# Patient Record
Sex: Female | Born: 1935 | Race: White | Hispanic: No | Marital: Married | Smoking: Never smoker
Health system: Southern US, Community
[De-identification: ages and names within clinical notes are randomized; demographics above are authoritative.]

## PROBLEM LIST (undated history)

## (undated) DIAGNOSIS — C801 Malignant (primary) neoplasm, unspecified: Secondary | ICD-10-CM

## (undated) DIAGNOSIS — D259 Leiomyoma of uterus, unspecified: Secondary | ICD-10-CM

## (undated) DIAGNOSIS — I739 Peripheral vascular disease, unspecified: Secondary | ICD-10-CM

## (undated) DIAGNOSIS — R739 Hyperglycemia, unspecified: Secondary | ICD-10-CM

## (undated) DIAGNOSIS — E785 Hyperlipidemia, unspecified: Secondary | ICD-10-CM

## (undated) DIAGNOSIS — I6529 Occlusion and stenosis of unspecified carotid artery: Secondary | ICD-10-CM

## (undated) DIAGNOSIS — M1990 Unspecified osteoarthritis, unspecified site: Secondary | ICD-10-CM

## (undated) DIAGNOSIS — N3281 Overactive bladder: Secondary | ICD-10-CM

## (undated) DIAGNOSIS — I10 Essential (primary) hypertension: Secondary | ICD-10-CM

## (undated) HISTORY — DX: Essential (primary) hypertension: I10

## (undated) HISTORY — DX: Occlusion and stenosis of unspecified carotid artery: I65.29

## (undated) HISTORY — DX: Hyperglycemia, unspecified: R73.9

## (undated) HISTORY — DX: Peripheral vascular disease, unspecified: I73.9

## (undated) HISTORY — PX: DIAGNOSTIC LAPAROSCOPY: SUR761

## (undated) HISTORY — DX: Overactive bladder: N32.81

## (undated) HISTORY — DX: Leiomyoma of uterus, unspecified: D25.9

## (undated) HISTORY — DX: Hyperlipidemia, unspecified: E78.5

## (undated) HISTORY — DX: Unspecified osteoarthritis, unspecified site: M19.90

---

## 1998-07-01 ENCOUNTER — Encounter: Payer: Self-pay | Admitting: *Deleted

## 1998-07-01 ENCOUNTER — Ambulatory Visit (HOSPITAL_COMMUNITY): Admission: RE | Admit: 1998-07-01 | Discharge: 1998-07-01 | Payer: Self-pay | Admitting: *Deleted

## 1998-07-17 ENCOUNTER — Ambulatory Visit (HOSPITAL_COMMUNITY): Admission: RE | Admit: 1998-07-17 | Discharge: 1998-07-17 | Payer: Self-pay | Admitting: *Deleted

## 1998-07-17 ENCOUNTER — Encounter: Payer: Self-pay | Admitting: *Deleted

## 1999-05-04 HISTORY — PX: COLONOSCOPY: SHX174

## 1999-05-19 ENCOUNTER — Other Ambulatory Visit: Admission: RE | Admit: 1999-05-19 | Discharge: 1999-05-19 | Payer: Self-pay | Admitting: Obstetrics and Gynecology

## 2000-02-24 ENCOUNTER — Ambulatory Visit (HOSPITAL_COMMUNITY): Admission: RE | Admit: 2000-02-24 | Discharge: 2000-02-24 | Payer: Self-pay | Admitting: *Deleted

## 2000-02-24 ENCOUNTER — Encounter: Payer: Self-pay | Admitting: *Deleted

## 2000-03-14 ENCOUNTER — Ambulatory Visit (HOSPITAL_COMMUNITY): Admission: RE | Admit: 2000-03-14 | Discharge: 2000-03-14 | Payer: Self-pay | Admitting: *Deleted

## 2000-03-14 ENCOUNTER — Encounter: Payer: Self-pay | Admitting: *Deleted

## 2000-09-12 ENCOUNTER — Other Ambulatory Visit: Admission: RE | Admit: 2000-09-12 | Discharge: 2000-09-12 | Payer: Self-pay | Admitting: Obstetrics and Gynecology

## 2001-10-23 ENCOUNTER — Encounter: Payer: Self-pay | Admitting: Obstetrics and Gynecology

## 2001-10-23 ENCOUNTER — Ambulatory Visit (HOSPITAL_COMMUNITY): Admission: RE | Admit: 2001-10-23 | Discharge: 2001-10-23 | Payer: Self-pay | Admitting: Obstetrics and Gynecology

## 2002-12-24 ENCOUNTER — Encounter: Payer: Self-pay | Admitting: Obstetrics and Gynecology

## 2002-12-24 ENCOUNTER — Ambulatory Visit (HOSPITAL_COMMUNITY): Admission: RE | Admit: 2002-12-24 | Discharge: 2002-12-24 | Payer: Self-pay | Admitting: Obstetrics and Gynecology

## 2009-05-06 ENCOUNTER — Encounter (INDEPENDENT_AMBULATORY_CARE_PROVIDER_SITE_OTHER): Payer: Self-pay | Admitting: *Deleted

## 2009-12-15 ENCOUNTER — Telehealth: Payer: Self-pay | Admitting: Internal Medicine

## 2010-06-02 NOTE — Progress Notes (Signed)
Summary: Schedule Colonoscopy  Phone Note Outgoing Call Call back at [facility] Phone [phone]   Call placed by: Harlow Mares CMA Duncan Dull),  December 15, 2009 8:58 AM Call placed to: Patient Summary of Call: Left message on patients machine to call back. patient is due for recall colonoscopy her last colonoscopy was with Dr. Victorino Dike in 2001 Initial call taken by: Harlow Mares CMA Duncan Dull),  December 15, 2009 8:58 AM  Follow-up for Phone Call        pt will c/b to sch COL at a later time, husband has broken two ribs and she needs to be taking care of him right now Follow-up by: Harlow Mares CMA Duncan Dull),  December 15, 2009 9:31 AM

## 2010-06-02 NOTE — Letter (Signed)
Summary: Colonoscopy Letter  [facility]  [address] [place], [place] [address]   Phone: [phone]  Fax: [phone]      May 06, 2009 MRN: [id number]   Rachel Mcguire [address] [place], [place]  [address]   Dear Ms. Poblano,   According to your medical record, it is time for you to schedule a Colonoscopy. The American Cancer Society recommends this procedure as a method to detect early colon cancer. Patients with a family history of colon cancer, or a personal history of colon polyps or inflammatory bowel disease are at increased risk.  This letter has beeen generated based on the recommendations made at the time of your procedure. If you feel that in your particular situation this may no longer apply, please contact our office.  Please call our office at [phone] to schedule this appointment or to update your records at your earliest convenience.  Thank you for cooperating with [place] to provide you with the very best care possible.   Sincerely,  Hedwig Morton. Juanda Chance, M.D.  [facility] [phone]

## 2010-10-06 ENCOUNTER — Encounter: Payer: Self-pay | Admitting: Internal Medicine

## 2010-10-16 ENCOUNTER — Telehealth: Payer: Self-pay

## 2010-10-16 NOTE — Telephone Encounter (Signed)
Message copied by Annett Fabian on Fri Oct 16, 2010  9:33 AM ------      Message from: Iva Boop      Created: Tue Oct 13, 2010  8:20 PM      Regarding: RE: ? schedule colonoscpy       Call her and ask her who she wants and arrange please      ----- Message -----         From: Rossie Muskrat, RN,CGRN         Sent: 10/08/2010   3:51 PM           To: Iva Boop, MD      Subject: RE: ? schedule colonoscpy                                There was a recall letter sent to her last year for [facility].  She does have a history with Dr Doreatha Martin      ----- Message -----         From: Iva Boop, MD         Sent: 10/08/2010   3:33 PM           To: Rossie Muskrat, RN,CGRN      Subject: RE: ? schedule colonoscpy                                I was not sure either - ask patient for clarification ? If she was Dr. Blossom Hoops and being assigned      ----- Message -----         From: Rossie Muskrat, RN,CGRN         Sent: 10/08/2010   3:30 PM           To: Iva Boop, MD      Subject: FW: ? 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      ----- Message -----         From: Iva Boop, MD         Sent: 10/06/2010   4:44 PM           To: Rossie Muskrat, RN,CGRN      Subject: ? schedule colonoscpy                                    She called [place] in 2011 re needing to delay colonoscopy            Chart came to me recently            Please call her and see if she is ready to do and arrange direct colonoscopy [phone]

## 2010-10-16 NOTE — Telephone Encounter (Signed)
Patient is scheduled with Dr Leone Payor for 11/19/10 for colon and pre-visit 11/10/10 8:00

## 2010-11-10 ENCOUNTER — Ambulatory Visit (AMBULATORY_SURGERY_CENTER): Payer: Medicare Other

## 2010-11-10 VITALS — Ht 62.5 in | Wt 137.3 lb

## 2010-11-10 DIAGNOSIS — Z1211 Encounter for screening for malignant neoplasm of colon: Secondary | ICD-10-CM

## 2010-11-10 MED ORDER — PEG-KCL-NACL-NASULF-NA ASC-C 100 G PO SOLR: 1.0000 | Freq: Once | ORAL | Status: AC

## 2010-11-19 ENCOUNTER — Ambulatory Visit (AMBULATORY_SURGERY_CENTER): Payer: Medicare Other | Admitting: Internal Medicine

## 2010-11-19 ENCOUNTER — Encounter: Payer: Self-pay | Admitting: Internal Medicine

## 2010-11-19 VITALS — BP 132/76 | HR 64 | Temp 99.1°F | Resp 18 | Ht 62.5 in | Wt 131.0 lb

## 2010-11-19 DIAGNOSIS — Z1211 Encounter for screening for malignant neoplasm of colon: Secondary | ICD-10-CM

## 2010-11-19 DIAGNOSIS — K5730 Diverticulosis of large intestine without perforation or abscess without bleeding: Secondary | ICD-10-CM

## 2010-11-19 DIAGNOSIS — K649 Unspecified hemorrhoids: Secondary | ICD-10-CM

## 2010-11-19 MED ORDER — SODIUM CHLORIDE 0.9 % IV SOLN: 500.0000 mL | INTRAVENOUS | Status: DC

## 2010-11-19 NOTE — Patient Instructions (Signed)
Please read the handouts given to you by your recovery room nurse.    Please, increase the fiber in your diet.  It will help improve the diverticulosis and hemorrhoids found today.  Resume your routine medications today.   If you have any questions or concerns, please call us at [phone].  Thank-you.

## 2010-11-20 ENCOUNTER — Telehealth: Payer: Self-pay | Admitting: *Deleted

## 2010-11-20 NOTE — Telephone Encounter (Signed)

## 2011-02-03 ENCOUNTER — Other Ambulatory Visit: Payer: Self-pay

## 2011-02-19 ENCOUNTER — Encounter: Payer: Self-pay | Admitting: Vascular Surgery

## 2011-02-23 ENCOUNTER — Encounter: Payer: Medicare Other | Admitting: Vascular Surgery

## 2011-03-01 ENCOUNTER — Encounter: Payer: Self-pay | Admitting: Vascular Surgery

## 2011-03-01 ENCOUNTER — Ambulatory Visit (INDEPENDENT_AMBULATORY_CARE_PROVIDER_SITE_OTHER): Payer: Medicare Other | Admitting: Vascular Surgery

## 2011-03-01 DIAGNOSIS — I6529 Occlusion and stenosis of unspecified carotid artery: Secondary | ICD-10-CM

## 2011-03-02 ENCOUNTER — Ambulatory Visit (INDEPENDENT_AMBULATORY_CARE_PROVIDER_SITE_OTHER): Payer: Medicare Other | Admitting: Vascular Surgery

## 2011-03-02 ENCOUNTER — Encounter: Payer: Self-pay | Admitting: Vascular Surgery

## 2011-03-02 ENCOUNTER — Other Ambulatory Visit: Payer: Self-pay | Admitting: Vascular Surgery

## 2011-03-02 VITALS — BP 143/79 | HR 78 | Resp 16 | Ht 62.5 in | Wt 132.0 lb

## 2011-03-02 DIAGNOSIS — Z01818 Encounter for other preprocedural examination: Secondary | ICD-10-CM

## 2011-03-02 DIAGNOSIS — I6529 Occlusion and stenosis of unspecified carotid artery: Secondary | ICD-10-CM

## 2011-03-02 NOTE — Progress Notes (Signed)
The patient presents today for evaluation of asymptomatic left carotid stenosis. She had been seen in a carotid screening study and was found to have significant left carotid disease. She is seen today for further evaluation. She denies any prior amaurosis fugax transient ischemic attack or stroke. She is quite active without any major physical limitation. He does not have any significant cardiac disease.  Past Medical History  Diagnosis Date  . Hypertension   . Hyperlipidemia   . Osteoporosis   . Hyperglycemia   . Peripheral vascular disease   . Carotid artery occlusion   . Uterine fibroid   . Overactive bladder   . Arthritis     both hands    History  Substance Use Topics  . Smoking status: Never Smoker   . Smokeless tobacco: Never Used  . Alcohol Use: No    Family History  Problem Relation Age of Onset  . Heart disease Father   . Stroke Father   . Breast cancer Paternal Grandmother     Allergies  Allergen Reactions  . Penicillins Itching and Rash    Current outpatient prescriptions:amLODipine-benazepril (LOTREL) 5-20 MG per capsule, Take 1 capsule by mouth daily.  , Disp: , Rfl: ;  aspirin 81 MG tablet, Take 81 mg by mouth daily.  , Disp: , Rfl: ;  b complex vitamins tablet, Take 1 tablet by mouth daily.  , Disp: , Rfl: ;  calcium-vitamin D (OSCAL WITH D) 500-200 MG-UNIT per tablet, Take 1 tablet by mouth 2 (two) times daily.  , Disp: , Rfl:  Cholecalciferol (VITAMIN D3) 1000 UNITS CAPS, Take 1,000 Units by mouth daily.  , Disp: , Rfl: ;  Coenzyme Q10 (CO Q 10) 100 MG CAPS, Take by mouth.  , Disp: , Rfl: ;  fish oil-omega-3 fatty acids 1000 MG capsule, Take 2 g by mouth daily.  , Disp: , Rfl: ;  hydrochlorothiazide 25 MG tablet, Take 25 mg by mouth daily.  , Disp: , Rfl: ;  Magnesium 250 MG TABS, Take by mouth.  , Disp: , Rfl:  multivitamin (THERAGRAN) per tablet, Take 1 tablet by mouth daily.  , Disp: , Rfl: ;  oxybutynin (DITROPAN-XL) 10 MG 24 hr tablet, 20 mg. , Disp: ,  Rfl: ;  Red Yeast Rice 600 MG CAPS, Take 2 capsules by mouth daily.  , Disp: , Rfl: ;  vitamin C (ASCORBIC ACID) 500 MG tablet, Take 500 mg by mouth daily.  , Disp: , Rfl: ;  glucosamine-chondroitin 500-400 MG tablet, Take 1 tablet by mouth daily.  , Disp: , Rfl:  Current facility-administered medications:0.9 %  sodium chloride infusion, 500 mL, Intravenous, Continuous, Iva Boop, MD  BP 143/79  Pulse 78  Resp 16  Ht 5' 2.5" (1.588 m)  Wt 132 lb (59.875 kg)  BMI 23.76 kg/m2  SpO2 97%  Body mass index is 23.76 kg/(m^2).       Review of systems: Urinary frequency, arthritis in her fingers otherwise normal with no positive review of systems  Physical exam ON well-developed well-nourished white female no acute distress. HEENT normal. Chest clear bilaterally without rales or wheezes. Heart regular rate and rhythm. Carotid arteries without bruits. 2+ radial pulses bilaterally. Abdomen soft nontender no masses noted. She is grossly intact neurologically. Psychiatric normal affect.  Carotid duplex in our office: This reveals high and a 60-79% stenosis in her left internal carotid artery moderate 40-59% on the right. He does have extensive calcification her left making exact stenosis difficult and  this may be underestimation.  Impression and plan: At least moderate to severe left internal carotid artery stenosis, asymptomatic. I have recommended CT angiogram for further evaluation. I suspect that this indeed represents critical stenosis and so would recommend endarterectomy. I discussed this at length with the patient and her husband present. We will proceed with outpatient CT angiogram of her neck and this does confirm high-grade stenosis would recommend endarterectomy for reduction of stroke risk. Procedure including 1-2% risk of stroke with surgery. She understands and wished to proceed following CT scan if indicated

## 2011-03-04 NOTE — Procedures (Unsigned)
CAROTID DUPLEX EXAM  INDICATION:  Followup carotid stenosis.  HISTORY: Diabetes:  No Cardiac:  No Hypertension:  Yes Smoking:  No Previous Surgery:  No carotid intervention CV History:  Asymptomatic Amaurosis Fugax No, Paresthesias No, Hemiparesis No                                      RIGHT             LEFT Brachial systolic pressure:         138               158 Brachial Doppler waveforms:         Within normal limits                Within normal limits Vertebral direction of flow:        Antegrade         Antegrade DUPLEX VELOCITIES (cm/sec) CCA peak systolic                   137               86 ECA peak systolic                   113               99 ICA peak systolic                   146               303 ICA end diastolic                   41                95 PLAQUE MORPHOLOGY:                  Calcified         Calcified PLAQUE AMOUNT:                      Moderate          Moderate to severe PLAQUE LOCATION:                    CCA/ICA           CCA/ICA  IMPRESSION: 1. Right internal carotid artery stenosis present in the 40%-59%     range. 2. Left internal carotid artery stenosis present in the 60%-79% range,     due to calcified plaque and acoustic shadowing stenosis may be     underestimated. 3. Bilateral vertebral arteries are patent and antegrade. 4. No previous study done at [facility] for     comparison.  ___________________________________________ Larina Earthly, M.D.  SH/MEDQ  D:  03/01/2011  T:  03/01/2011  Job:  [id number]

## 2011-03-05 ENCOUNTER — Ambulatory Visit
Admission: RE | Admit: 2011-03-05 | Discharge: 2011-03-05 | Disposition: A | Discharge status: Home / Self Care | Payer: Medicare Other | Source: Ambulatory Visit | Attending: Vascular Surgery | Admitting: Vascular Surgery

## 2011-03-05 DIAGNOSIS — I6529 Occlusion and stenosis of unspecified carotid artery: Secondary | ICD-10-CM

## 2011-03-05 DIAGNOSIS — Z01818 Encounter for other preprocedural examination: Secondary | ICD-10-CM

## 2011-03-05 MED ORDER — IOHEXOL 350 MG/ML SOLN
100.0000 mL | Freq: Once | INTRAVENOUS | Status: AC | PRN
  Administered 2011-03-05: 100 mL via INTRAVENOUS

## 2011-03-11 ENCOUNTER — Encounter: Payer: Self-pay | Admitting: Vascular Surgery

## 2011-03-11 DIAGNOSIS — I6529 Occlusion and stenosis of unspecified carotid artery: Secondary | ICD-10-CM | POA: Insufficient documentation

## 2011-03-29 ENCOUNTER — Encounter (HOSPITAL_COMMUNITY): Payer: Self-pay | Admitting: Pharmacy Technician

## 2011-04-01 ENCOUNTER — Other Ambulatory Visit: Payer: Self-pay | Admitting: *Deleted

## 2011-04-05 ENCOUNTER — Other Ambulatory Visit: Payer: Self-pay

## 2011-04-05 ENCOUNTER — Ambulatory Visit (HOSPITAL_COMMUNITY)
Admission: RE | Admit: 2011-04-05 | Discharge: 2011-04-05 | Disposition: A | Discharge status: Home / Self Care | Payer: Medicare Other | Source: Ambulatory Visit | Attending: Anesthesiology | Admitting: Anesthesiology

## 2011-04-05 ENCOUNTER — Encounter (HOSPITAL_COMMUNITY): Payer: Self-pay

## 2011-04-05 ENCOUNTER — Encounter (HOSPITAL_COMMUNITY)
Admission: RE | Admit: 2011-04-05 | Discharge: 2011-04-05 | Disposition: A | Discharge status: Home / Self Care | Payer: Medicare Other | Source: Ambulatory Visit | Attending: Vascular Surgery | Admitting: Vascular Surgery

## 2011-04-05 DIAGNOSIS — Z01818 Encounter for other preprocedural examination: Secondary | ICD-10-CM | POA: Insufficient documentation

## 2011-04-05 DIAGNOSIS — Z01812 Encounter for preprocedural laboratory examination: Secondary | ICD-10-CM | POA: Insufficient documentation

## 2011-04-05 DIAGNOSIS — Z01810 Encounter for preprocedural cardiovascular examination: Secondary | ICD-10-CM | POA: Insufficient documentation

## 2011-04-05 HISTORY — DX: Malignant (primary) neoplasm, unspecified: C80.1

## 2011-04-05 LAB — CBC
MCH: 28.9 pg (ref 26.0–34.0)
MCHC: 33 g/dL (ref 30.0–36.0)
Platelets: 292 10*3/uL (ref 150–400)

## 2011-04-05 LAB — URINALYSIS, ROUTINE W REFLEX MICROSCOPIC
Glucose, UA: NEGATIVE mg/dL
Leukocytes, UA: NEGATIVE
Protein, ur: NEGATIVE mg/dL

## 2011-04-05 LAB — DIFFERENTIAL
Eosinophils Relative: 1 % (ref 0–5)
Lymphocytes Relative: 29 % (ref 12–46)
Lymphs Abs: 1.2 10*3/uL (ref 0.7–4.0)
Monocytes Absolute: 0.4 10*3/uL (ref 0.1–1.0)

## 2011-04-05 LAB — COMPREHENSIVE METABOLIC PANEL
ALT: 18 U/L (ref 0–35)
AST: 20 U/L (ref 0–37)
Albumin: 4.2 g/dL (ref 3.5–5.2)
Calcium: 10 mg/dL (ref 8.4–10.5)
Sodium: 140 mEq/L (ref 135–145)
Total Protein: 7.8 g/dL (ref 6.0–8.3)

## 2011-04-05 LAB — SURGICAL PCR SCREEN: MRSA, PCR: NEGATIVE

## 2011-04-05 LAB — PROTIME-INR: Prothrombin Time: 13.1 seconds (ref 11.6–15.2)

## 2011-04-05 LAB — TYPE AND SCREEN: ABO/RH(D): A POS

## 2011-04-05 MED ORDER — VANCOMYCIN HCL IN DEXTROSE 1-5 GM/200ML-% IV SOLN: 1000.0000 mg | INTRAVENOUS | Status: DC

## 2011-04-05 NOTE — Pre-Procedure Instructions (Signed)
20 DENYSE FILLION  04/05/2011   Your procedure is scheduled on:  04/12/11  Report to [facility] at 0530 AM.  Call this number if you have problems the morning of surgery: [phone]   Remember:   Do not eat food:After Midnight.  May have clear liquids: up to 4 Hours before arrival.  Clear liquids include soda, tea, black coffee, apple or grape juice, broth.  Take these medicines the morning of surgery with A SIP OF WATER: AMLODIPINE  DITROPAN   Do not wear jewelry, make-up or nail polish.  Do not wear lotions, powders, or perfumes. You may wear deodorant.  Do not shave 48 hours prior to surgery.  Do not bring valuables to the hospital.  Contacts, dentures or bridgework may not be worn into surgery.  Leave suitcase in the car. After surgery it may be brought to your room.  For patients admitted to the hospital, checkout time is 11:00 AM the day of discharge.   Patients discharged the day of surgery will not be allowed to drive home.  Name and phone number of your driver: Karrie Meres  [phone]  Special Instructions: CHG Shower Use Special Wash: 1/2 bottle night before surgery and 1/2 bottle morning of surgery.   Please read over the following fact sheets that you were given: Pain Booklet, Coughing and Deep Breathing, Blood Transfusion Information, MRSA Information and Surgical Site Infection Prevention

## 2011-04-12 ENCOUNTER — Encounter (HOSPITAL_COMMUNITY): Payer: Self-pay

## 2011-04-12 ENCOUNTER — Inpatient Hospital Stay (HOSPITAL_COMMUNITY): Payer: Medicare Other

## 2011-04-12 ENCOUNTER — Other Ambulatory Visit: Payer: Self-pay

## 2011-04-12 ENCOUNTER — Encounter (HOSPITAL_COMMUNITY)
Admission: RE | Disposition: A | Discharge status: Home / Self Care | Payer: Self-pay | Source: Ambulatory Visit | Attending: Vascular Surgery

## 2011-04-12 ENCOUNTER — Other Ambulatory Visit: Payer: Self-pay | Admitting: Vascular Surgery

## 2011-04-12 ENCOUNTER — Inpatient Hospital Stay (HOSPITAL_COMMUNITY)
Admission: RE | Admit: 2011-04-12 | Discharge: 2011-04-13 | DRG: 039 | Disposition: A | Discharge status: Home / Self Care | Payer: Medicare Other | Source: Ambulatory Visit | Attending: Vascular Surgery | Admitting: Vascular Surgery

## 2011-04-12 ENCOUNTER — Encounter (HOSPITAL_COMMUNITY): Payer: Self-pay | Admitting: *Deleted

## 2011-04-12 DIAGNOSIS — I6529 Occlusion and stenosis of unspecified carotid artery: Principal | ICD-10-CM

## 2011-04-12 DIAGNOSIS — Z85828 Personal history of other malignant neoplasm of skin: Secondary | ICD-10-CM

## 2011-04-12 DIAGNOSIS — M810 Age-related osteoporosis without current pathological fracture: Secondary | ICD-10-CM | POA: Diagnosis present

## 2011-04-12 HISTORY — PX: ENDARTERECTOMY: SHX5162

## 2011-04-12 HISTORY — PX: CAROTID ENDARTERECTOMY: SUR193

## 2011-04-12 SURGERY — ENDARTERECTOMY, CAROTID
Anesthesia: General | Site: Neck | Laterality: Left | Wound class: Clean

## 2011-04-12 MED ORDER — HYDRALAZINE HCL 20 MG/ML IJ SOLN
10.0000 mg | INTRAMUSCULAR | Status: DC | PRN
  Filled 2011-04-12: qty 0.5

## 2011-04-12 MED ORDER — SODIUM CHLORIDE 0.9 % IV SOLN
500.0000 mL | Freq: Once | INTRAVENOUS | Status: AC | PRN
  Administered 2011-04-12: 500 mL via INTRAVENOUS

## 2011-04-12 MED ORDER — ACETAMINOPHEN 325 MG PO TABS
325.0000 mg | ORAL_TABLET | ORAL | Status: DC | PRN
Start: 2011-04-12 — End: 2011-04-13

## 2011-04-12 MED ORDER — WHITE PETROLATUM GEL
Status: AC
  Filled 2011-04-12: qty 5

## 2011-04-12 MED ORDER — PROMETHAZINE HCL 25 MG/ML IJ SOLN
6.2500 mg | INTRAMUSCULAR | Status: DC | PRN
  Administered 2011-04-12: 6.25 mg via INTRAVENOUS

## 2011-04-12 MED ORDER — LABETALOL HCL 5 MG/ML IV SOLN: 10.0000 mg | INTRAVENOUS | Status: DC | PRN

## 2011-04-12 MED ORDER — HYDROCHLOROTHIAZIDE 25 MG PO TABS
25.0000 mg | ORAL_TABLET | Freq: Every day | ORAL | Status: DC
  Filled 2011-04-12 (×2): qty 1

## 2011-04-12 MED ORDER — ONDANSETRON HCL 4 MG/2ML IJ SOLN
INTRAMUSCULAR | Status: DC | PRN
  Administered 2011-04-12: 4 mg via INTRAVENOUS

## 2011-04-12 MED ORDER — OXYCODONE-ACETAMINOPHEN 5-325 MG PO TABS: 1.0000 | ORAL_TABLET | ORAL | Status: AC | PRN

## 2011-04-12 MED ORDER — MIDAZOLAM HCL 2 MG/2ML IJ SOLN: 1.0000 mg | INTRAMUSCULAR | Status: DC | PRN

## 2011-04-12 MED ORDER — OXYCODONE-ACETAMINOPHEN 5-325 MG PO TABS: 1.0000 | ORAL_TABLET | ORAL | Status: DC | PRN

## 2011-04-12 MED ORDER — ASPIRIN EC 325 MG PO TBEC
325.0000 mg | DELAYED_RELEASE_TABLET | Freq: Every day | ORAL | Status: DC
  Administered 2011-04-13: 325 mg via ORAL
  Filled 2011-04-12: qty 1

## 2011-04-12 MED ORDER — AMLODIPINE BESYLATE 10 MG PO TABS
10.0000 mg | ORAL_TABLET | Freq: Every day | ORAL | Status: DC
  Filled 2011-04-12 (×2): qty 1

## 2011-04-12 MED ORDER — HYDROMORPHONE HCL PF 1 MG/ML IJ SOLN
0.2500 mg | INTRAMUSCULAR | Status: DC | PRN
  Administered 2011-04-12 (×2): 0.25 mg via INTRAVENOUS
  Administered 2011-04-12 (×2): 0.5 mg via INTRAVENOUS

## 2011-04-12 MED ORDER — POTASSIUM CHLORIDE CRYS ER 20 MEQ PO TBCR: 20.0000 meq | EXTENDED_RELEASE_TABLET | Freq: Once | ORAL | Status: AC | PRN

## 2011-04-12 MED ORDER — OXYBUTYNIN CHLORIDE ER 10 MG PO TB24
10.0000 mg | ORAL_TABLET | Freq: Two times a day (BID) | ORAL | Status: DC
  Administered 2011-04-12 – 2011-04-13 (×2): 10 mg via ORAL
  Filled 2011-04-12 (×3): qty 1

## 2011-04-12 MED ORDER — DOPAMINE-DEXTROSE 3.2-5 MG/ML-% IV SOLN
INTRAVENOUS | Status: AC
  Filled 2011-04-12: qty 250

## 2011-04-12 MED ORDER — MORPHINE SULFATE 2 MG/ML IJ SOLN
2.0000 mg | INTRAMUSCULAR | Status: DC | PRN
  Administered 2011-04-12 (×2): 2 mg via INTRAVENOUS
  Filled 2011-04-12 (×2): qty 1

## 2011-04-12 MED ORDER — FENTANYL CITRATE 0.05 MG/ML IJ SOLN: 50.0000 ug | INTRAMUSCULAR | Status: DC | PRN

## 2011-04-12 MED ORDER — GUAIFENESIN-DM 100-10 MG/5ML PO SYRP: 15.0000 mL | ORAL_SOLUTION | ORAL | Status: DC | PRN

## 2011-04-12 MED ORDER — PROTAMINE SULFATE 10 MG/ML IV SOLN
INTRAVENOUS | Status: DC | PRN
  Administered 2011-04-12: 50 mg via INTRAVENOUS

## 2011-04-12 MED ORDER — ASPIRIN EC 81 MG PO TBEC
81.0000 mg | DELAYED_RELEASE_TABLET | Freq: Every day | ORAL | Status: DC
  Filled 2011-04-12: qty 1

## 2011-04-12 MED ORDER — HEPARIN SODIUM (PORCINE) 5000 UNIT/ML IJ SOLN
INTRAMUSCULAR | Status: DC | PRN
  Administered 2011-04-12: 08:00:00

## 2011-04-12 MED ORDER — SODIUM CHLORIDE 0.9 % IV SOLN: INTRAVENOUS | Status: DC

## 2011-04-12 MED ORDER — FAMOTIDINE IN NACL 20-0.9 MG/50ML-% IV SOLN
20.0000 mg | Freq: Two times a day (BID) | INTRAVENOUS | Status: DC
  Administered 2011-04-12: 20 mg via INTRAVENOUS
  Filled 2011-04-12 (×3): qty 50

## 2011-04-12 MED ORDER — ONDANSETRON HCL 4 MG/2ML IJ SOLN
4.0000 mg | Freq: Four times a day (QID) | INTRAMUSCULAR | Status: DC | PRN
  Administered 2011-04-12: 4 mg via INTRAVENOUS
  Filled 2011-04-12: qty 2

## 2011-04-12 MED ORDER — LACTATED RINGERS IV SOLN
INTRAVENOUS | Status: DC | PRN
  Administered 2011-04-12 (×2): via INTRAVENOUS

## 2011-04-12 MED ORDER — DOPAMINE-DEXTROSE 3.2-5 MG/ML-% IV SOLN
3.0000 ug/kg/min | INTRAVENOUS | Status: DC
  Administered 2011-04-12: 3 ug/kg/min via INTRAVENOUS
  Filled 2011-04-12: qty 250

## 2011-04-12 MED ORDER — AMLODIPINE BESY-BENAZEPRIL HCL 5-20 MG PO CAPS: 2.0000 | ORAL_CAPSULE | Freq: Every day | ORAL | Status: DC

## 2011-04-12 MED ORDER — GLYCOPYRROLATE 0.2 MG/ML IJ SOLN
INTRAMUSCULAR | Status: DC | PRN
  Administered 2011-04-12: 0.2 mg via INTRAVENOUS
  Administered 2011-04-12: .4 mg via INTRAVENOUS

## 2011-04-12 MED ORDER — VANCOMYCIN HCL IN DEXTROSE 1-5 GM/200ML-% IV SOLN
1000.0000 mg | Freq: Two times a day (BID) | INTRAVENOUS | Status: AC
  Administered 2011-04-12 – 2011-04-13 (×2): 1000 mg via INTRAVENOUS
  Filled 2011-04-12 (×2): qty 200

## 2011-04-12 MED ORDER — ASPIRIN 81 MG PO TABS: 81.0000 mg | ORAL_TABLET | Freq: Every day | ORAL | Status: DC

## 2011-04-12 MED ORDER — PHENOL 1.4 % MT LIQD: 1.0000 | OROMUCOSAL | Status: DC | PRN

## 2011-04-12 MED ORDER — VECURONIUM BROMIDE 10 MG IV SOLR
INTRAVENOUS | Status: DC | PRN
  Administered 2011-04-12: 8 mg via INTRAVENOUS
  Administered 2011-04-12 (×2): 1 mg via INTRAVENOUS

## 2011-04-12 MED ORDER — PROPOFOL 10 MG/ML IV EMUL
INTRAVENOUS | Status: DC | PRN
  Administered 2011-04-12: 125 mg via INTRAVENOUS

## 2011-04-12 MED ORDER — NEOSTIGMINE METHYLSULFATE 1 MG/ML IJ SOLN
INTRAMUSCULAR | Status: DC | PRN
  Administered 2011-04-12: 3 mg via INTRAVENOUS

## 2011-04-12 MED ORDER — METOPROLOL TARTRATE 1 MG/ML IV SOLN: 2.0000 mg | INTRAVENOUS | Status: DC | PRN

## 2011-04-12 MED ORDER — SODIUM CHLORIDE 0.9 % IR SOLN
Status: DC | PRN
  Administered 2011-04-12: 1

## 2011-04-12 MED ORDER — SODIUM CHLORIDE 0.9 % IV SOLN
10.0000 mg | INTRAVENOUS | Status: DC | PRN
  Administered 2011-04-12: 50 ug/min via INTRAVENOUS

## 2011-04-12 MED ORDER — DEXTROSE-NACL 5-0.9 % IV SOLN
INTRAVENOUS | Status: DC
  Administered 2011-04-12: 12:00:00 via INTRAVENOUS

## 2011-04-12 MED ORDER — LORAZEPAM 2 MG/ML IJ SOLN: 1.0000 mg | Freq: Once | INTRAMUSCULAR | Status: DC | PRN

## 2011-04-12 MED ORDER — ACETAMINOPHEN 650 MG RE SUPP: 325.0000 mg | RECTAL | Status: DC | PRN

## 2011-04-12 MED ORDER — BENAZEPRIL HCL 40 MG PO TABS
40.0000 mg | ORAL_TABLET | Freq: Every day | ORAL | Status: DC
  Filled 2011-04-12 (×2): qty 1

## 2011-04-12 MED ORDER — DOCUSATE SODIUM 100 MG PO CAPS
100.0000 mg | ORAL_CAPSULE | Freq: Every day | ORAL | Status: DC
  Administered 2011-04-13: 100 mg via ORAL
  Filled 2011-04-12: qty 1

## 2011-04-12 MED ORDER — HEPARIN SODIUM (PORCINE) 1000 UNIT/ML IJ SOLN
INTRAMUSCULAR | Status: DC | PRN
  Administered 2011-04-12: 6000 [IU] via INTRAVENOUS

## 2011-04-12 MED ORDER — PROMETHAZINE HCL 25 MG/ML IJ SOLN
INTRAMUSCULAR | Status: AC
  Filled 2011-04-12: qty 1

## 2011-04-12 MED ORDER — VANCOMYCIN HCL IN DEXTROSE 1-5 GM/200ML-% IV SOLN
INTRAVENOUS | Status: AC
  Filled 2011-04-12: qty 200

## 2011-04-12 MED ORDER — FENTANYL CITRATE 0.05 MG/ML IJ SOLN
INTRAMUSCULAR | Status: DC | PRN
  Administered 2011-04-12: 25 ug via INTRAVENOUS
  Administered 2011-04-12: 75 ug via INTRAVENOUS
  Administered 2011-04-12: 25 ug via INTRAVENOUS

## 2011-04-12 SURGICAL SUPPLY — 46 items
APL SKNCLS STERI-STRIP NONHPOA (GAUZE/BANDAGES/DRESSINGS) ×1
BENZOIN TINCTURE PRP APPL 2/3 (GAUZE/BANDAGES/DRESSINGS) ×2 IMPLANT
CANISTER SUCTION 2500CC (MISCELLANEOUS) ×2 IMPLANT
CATH ROBINSON RED A/P 18FR (CATHETERS) ×2 IMPLANT
CLOSURE STERI STRIP 1/2 X4 (GAUZE/BANDAGES/DRESSINGS) ×1 IMPLANT
CLOTH BEACON ORANGE TIMEOUT ST (SAFETY) ×2 IMPLANT
COVER SURGICAL LIGHT HANDLE (MISCELLANEOUS) ×4 IMPLANT
CRADLE DONUT ADULT HEAD (MISCELLANEOUS) ×2 IMPLANT
DECANTER SPIKE VIAL GLASS SM (MISCELLANEOUS) IMPLANT
DRAIN HEMOVAC 1/8 X 5 (WOUND CARE) IMPLANT
DRAPE WARM FLUID 44X44 (DRAPE) ×2 IMPLANT
DRSG COVADERM 4X8 (GAUZE/BANDAGES/DRESSINGS) ×1 IMPLANT
ELECT REM PT RETURN 9FT ADLT (ELECTROSURGICAL) ×2
ELECTRODE REM PT RTRN 9FT ADLT (ELECTROSURGICAL) ×1 IMPLANT
EVACUATOR SILICONE 100CC (DRAIN) IMPLANT
GEL ULTRASOUND 20GR AQUASONIC (MISCELLANEOUS) IMPLANT
GLOVE BIOGEL PI IND STRL 6.5 (GLOVE) IMPLANT
GLOVE BIOGEL PI IND STRL 7.5 (GLOVE) IMPLANT
GLOVE BIOGEL PI INDICATOR 6.5 (GLOVE) ×3
GLOVE BIOGEL PI INDICATOR 7.5 (GLOVE) ×2
GLOVE SS BIOGEL STRL SZ 7.5 (GLOVE) ×1 IMPLANT
GLOVE SS N UNI LF 6.0 STRL (GLOVE) ×1 IMPLANT
GLOVE SS N UNI LF 7.5 STRL (GLOVE) ×2 IMPLANT
GLOVE SUPERSENSE BIOGEL SZ 7.5 (GLOVE) ×1
GOWN STRL NON-REIN LRG LVL3 (GOWN DISPOSABLE) ×6 IMPLANT
KIT BASIN OR (CUSTOM PROCEDURE TRAY) ×2 IMPLANT
KIT ROOM TURNOVER OR (KITS) ×2 IMPLANT
NEEDLE 22X1 1/2 (OR ONLY) (NEEDLE) IMPLANT
NS IRRIG 1000ML POUR BTL (IV SOLUTION) ×4 IMPLANT
PACK CAROTID (CUSTOM PROCEDURE TRAY) ×2 IMPLANT
PAD ARMBOARD 7.5X6 YLW CONV (MISCELLANEOUS) ×4 IMPLANT
PATCH HEMASHIELD 8X75 (Vascular Products) ×1 IMPLANT
SHUNT CAROTID BYPASS 10 (VASCULAR PRODUCTS) IMPLANT
SHUNT CAROTID BYPASS 12FRX15.5 (VASCULAR PRODUCTS) IMPLANT
SPECIMEN JAR SMALL (MISCELLANEOUS) ×2 IMPLANT
STRIP CLOSURE SKIN 1/2X4 (GAUZE/BANDAGES/DRESSINGS) ×2 IMPLANT
SUT ETHILON 3 0 PS 1 (SUTURE) IMPLANT
SUT PROLENE 6 0 CC (SUTURE) ×2 IMPLANT
SUT VIC AB 3-0 SH 27 (SUTURE) ×4
SUT VIC AB 3-0 SH 27X BRD (SUTURE) ×2 IMPLANT
SUT VICRYL 4-0 PS2 18IN ABS (SUTURE) ×2 IMPLANT
SYR CONTROL 10ML LL (SYRINGE) IMPLANT
TOWEL OR 17X24 6PK STRL BLUE (TOWEL DISPOSABLE) ×2 IMPLANT
TOWEL OR 17X26 10 PK STRL BLUE (TOWEL DISPOSABLE) ×2 IMPLANT
TRAY FOLEY CATH 14FRSI W/METER (CATHETERS) ×2 IMPLANT
WATER STERILE IRR 1000ML POUR (IV SOLUTION) ×2 IMPLANT

## 2011-04-12 NOTE — H&P (Signed)
Larina Earthly, MD 03/02/2011 5:28 PM Signed  The patient presents today for evaluation of asymptomatic left carotid stenosis. She had been seen in a carotid screening study and was found to have significant left carotid disease. She is seen today for further evaluation. She denies any prior amaurosis fugax transient ischemic attack or stroke. She is quite active without any major physical limitation. He does not have any significant cardiac disease.  Past Medical History   Diagnosis  Date   .  Hypertension    .  Hyperlipidemia    .  Osteoporosis    .  Hyperglycemia    .  Peripheral vascular disease    .  Carotid artery occlusion    .  Uterine fibroid    .  Overactive bladder    .  Arthritis      both hands    History   Substance Use Topics   .  Smoking status:  Never Smoker   .  Smokeless tobacco:  Never Used   .  Alcohol Use:  No    Family History   Problem  Relation  Age of Onset   .  Heart disease  Father    .  Stroke  Father    .  Breast cancer  Paternal Grandmother     Allergies   Allergen  Reactions   .  Penicillins  Itching and Rash    Current outpatient prescriptions:amLODipine-benazepril (LOTREL) 5-20 MG per capsule, Take 1 capsule by mouth daily. , Disp: , Rfl: ; aspirin 81 MG tablet, Take 81 mg by mouth daily. , Disp: , Rfl: ; b complex vitamins tablet, Take 1 tablet by mouth daily. , Disp: , Rfl: ; calcium-vitamin D (OSCAL WITH D) 500-200 MG-UNIT per tablet, Take 1 tablet by mouth 2 (two) times daily. , Disp: , Rfl:  Cholecalciferol (VITAMIN D3) 1000 UNITS CAPS, Take 1,000 Units by mouth daily. , Disp: , Rfl: ; Coenzyme Q10 (CO Q 10) 100 MG CAPS, Take by mouth. , Disp: , Rfl: ; fish oil-omega-3 fatty acids 1000 MG capsule, Take 2 g by mouth daily. , Disp: , Rfl: ; hydrochlorothiazide 25 MG tablet, Take 25 mg by mouth daily. , Disp: , Rfl: ; Magnesium 250 MG TABS, Take by mouth. , Disp: , Rfl:  multivitamin (THERAGRAN) per tablet, Take 1 tablet by mouth daily. , Disp: ,  Rfl: ; oxybutynin (DITROPAN-XL) 10 MG 24 hr tablet, 20 mg. , Disp: , Rfl: ; Red Yeast Rice 600 MG CAPS, Take 2 capsules by mouth daily. , Disp: , Rfl: ; vitamin C (ASCORBIC ACID) 500 MG tablet, Take 500 mg by mouth daily. , Disp: , Rfl: ; glucosamine-chondroitin 500-400 MG tablet, Take 1 tablet by mouth daily. , Disp: , Rfl:  Current facility-administered medications:0.9 % sodium chloride infusion, 500 mL, Intravenous, Continuous, Iva Boop, MD  BP 143/79  Pulse 78  Resp 16  Ht 5' 2.5" (1.588 m)  Wt 132 lb (59.875 kg)  BMI 23.76 kg/m2  SpO2 97%  Body mass index is 23.76 kg/(m^2).  Review of systems: Urinary frequency, arthritis in her fingers otherwise normal with no positive review of systems  Physical exam ON well-developed well-nourished white female no acute distress. HEENT normal. Chest clear bilaterally without rales or wheezes. Heart regular rate and rhythm. Carotid arteries without bruits. 2+ radial pulses bilaterally. Abdomen soft nontender no masses noted. She is grossly intact neurologically. Psychiatric normal affect.  Carotid duplex in our office: This reveals high and a  60-79% stenosis in her left internal carotid artery moderate 40-59% on the right. He does have extensive calcification her left making exact stenosis difficult and this may be underestimation.  Impression and plan: At least moderate to severe left internal carotid artery stenosis, asymptomatic. I have recommended CT angiogram for further evaluation. I suspect that this indeed represents critical stenosis and so would recommend endarterectomy. I discussed this at length with the patient and her husband present. We will proceed with outpatient CT angiogram of her neck and this does confirm high-grade stenosis would recommend endarterectomy for reduction of stroke risk.  Procedure including 1-2% risk of stroke with surgery. She understands and wished to proceed following CT scan if indicated

## 2011-04-12 NOTE — H&P (Signed)
  [facility] H&P UPDATE  The patient has been re-examined and re-evaluated.  The patient's history and physical has been reviewed and is unchanged.  There is no change in pt. status or plan of care, stable for surgery.No change from H&P from 03/02/2011   Rachel Mcguire F 04/12/2011 7:25 AM [facility]

## 2011-04-12 NOTE — Transfer of Care (Signed)
Immediate Anesthesia Transfer of Care Note  Patient: Rachel Mcguire  Procedure(s) Performed:  ENDARTERECTOMY CAROTID  Patient Location: PACU  Anesthesia Type: General  Level of Consciousness: awake, alert , oriented and patient cooperative  Airway & Oxygen Therapy: Patient Spontanous Breathing and Patient connected to nasal cannula oxygen  Post-op Assessment: Report given to PACU RN, Post -op Vital signs reviewed and stable, Patient moving all extremities and Patient able to stick tongue midline  Post vital signs: Reviewed and stable  Complications: No apparent anesthesia complications

## 2011-04-12 NOTE — Interval H&P Note (Signed)
History and Physical Interval Note:  04/12/2011 7:29 AM  Rachel Mcguire  has presented today for surgery, with the diagnosis of Left ICA Stenosis  The various methods of treatment have been discussed with the patient and family. After consideration of risks, benefits and other options for treatment, the patient has consented to  Procedure(s): ENDARTERECTOMY CAROTID as a surgical intervention .  The patients' history has been reviewed, patient examined, no change in status, stable for surgery.  I have reviewed the patients' chart and labs.  Questions were answered to the patient's satisfaction.     Larina Earthly

## 2011-04-12 NOTE — Op Note (Signed)
[facility] [place]  Patient name: Rachel Mcguire MRN: [id number] DOB: 07/14/1931 Sex: female  04/12/2011 Pre-operative Diagnosis: Asymptomatic left carotid stenosis Post-operative diagnosis:  Same Surgeon:  Larina Earthly, M.D. Assistants:  Roczniac PA Procedure:    left carotid Endarterectomy with Dacron patch angioplasty Anesthesia:  General Blood Loss:  See anesthesia record Specimens:  Carotid Plaque to pathology  Indications for surgery:  Asymptomatic left ICA stenosis  Procedure in detail:  The patient was taken to the operating and placed in the supine position. The neck was prepped and draped in the usual sterile fashion. An incision was made anterior to the sternocleidomastoid muscle and continued with electrocautery through the platysma muscle. The muscle was retracted posteriorly and the carotid sheath was opened. The facial vein was ligated with 2-0 silk ties and divided. The common carotid artery was encircled with an umbilical tape and Rummel tourniquet. Dissection was continued onto the carotid bifurcation. The superior thyroid artery was controlled with a 2-0 silk Potts tie. The external carotid organ was encircled with a vessel loop and the internal carotid was encircled with umbilical tape and Rummel tourniquet. The hypoglossal and vagus nerves were identified and preserved.  The patient was given systemic heparinization. After adequate circulation time, the internal,external and common carotid arteries were occluded. The common carotid was opened with an 11 blade and the arteriotomy was continued with Potts scissors onto the internal carotid artery. A 10 shunt was passed up the internal carotid artery, allowed to back bleed, and then passed down the common carotid artery. The shunt was secured with Rummel tourniquet. The endarterectomy was begun on the common carotid artery  plaque was divided proximally with Potts scissors. The endarterectomy was  continued onto the carotid bifurcation. The external carotid was endarterectomized by eversion technique and the internal carotid artery was endarterectomized in an open fashion. Remaining debris was removed from the endarterectomy plane. A Dacron patch was brought to the field and sewn as a patch angioplasty. Prior to completing the anastomosis, the shunt was removed and the usual flushing maneuvers were undertaken. The anastomosis was then completed and flow was restored first to the external and then the internal carotid artery. Excellent flow characteristics were noted with hand-held Doppler in the internal and external carotid arteries.  The patient was given protamine to reverse the heparin. Hemostasis was obtained with electrocautery. The wounds were irrigated with saline. The wound was closed by first reapproximating the sternocleidomastoid muscle over the carotid artery with interrupted 3-0 Vicryl sutures. Next, the platysma was closed with a running 3-0 Vicryl suture. The skin was closed with a 4-0 subcuticular Vicryl suture. Benzoin and Steri-Strips were applied to the incision. A sterile dressing was placed over the incision. All sponge and needle counts were correct. The patient was awakened in the operating room, neurologically intact. They were transferred to the PACU in stable condition.   Disposition:  To PACU in stable condition,neurologically intact  Larina Earthly, M.D. [facility] [place] Office: [phone] Pager:  [phone]

## 2011-04-12 NOTE — Anesthesia Postprocedure Evaluation (Signed)
  Anesthesia Post-op Note  Patient: Rachel Mcguire  Procedure(s) Performed:  ENDARTERECTOMY CAROTID  Patient Location: PACU  Anesthesia Type: General  Level of Consciousness: awake, alert  and oriented  Airway and Oxygen Therapy: Patient Spontanous Breathing  Post-op Pain: mild  Post-op Assessment: Post-op Vital signs reviewed  Post-op Vital Signs: stable  Complications: No apparent anesthesia complications

## 2011-04-12 NOTE — Preoperative (Signed)
Beta Blockers   Reason not to administer Beta Blockers:Not Applicable 

## 2011-04-12 NOTE — Anesthesia Procedure Notes (Addendum)
Procedure Name: Intubation Date/Time: 04/12/2011 7:44 AM Performed by: Carmela Rima Pre-anesthesia Checklist: Patient identified, Timeout performed, Emergency Drugs available, Suction available and Patient being monitored Patient Re-evaluated:Patient Re-evaluated prior to inductionOxygen Delivery Method: Circle System Utilized Preoxygenation: Pre-oxygenation with 100% oxygen Intubation Type: IV induction Ventilation: Mask ventilation without difficulty Laryngoscope Size: Mac and 3 Grade View: Grade II Tube type: Oral Number of attempts: 1 (intubation by [employer], SRNA) Placement Confirmation: ETT inserted through vocal cords under direct vision,  breath sounds checked- equal and bilateral,  positive ETCO2 and CO2 detector Secured at: 21 cm Tube secured with: Tape Dental Injury: Teeth and Oropharynx as per pre-operative assessment

## 2011-04-12 NOTE — Anesthesia Preprocedure Evaluation (Addendum)
Anesthesia Evaluation  Patient identified by MRN, date of birth, ID band Patient awake    Reviewed: Allergy & Precautions, H&P , NPO status , Patient's Chart, lab work & pertinent test results  Airway Mallampati: I TM Distance: >3 FB Neck ROM: Full    Dental   Pulmonary    Pulmonary exam normal       Cardiovascular hypertension, Pt. on medications and On Medications     Neuro/Psych    GI/Hepatic   Endo/Other    Renal/GU      Musculoskeletal   Abdominal   Peds  Hematology   Anesthesia Other Findings   Reproductive/Obstetrics                          Anesthesia Physical Anesthesia Plan  ASA: III  Anesthesia Plan: General   Post-op Pain Management:    Induction: Intravenous  Airway Management Planned: Oral ETT  Additional Equipment: Arterial line  Intra-op Plan:   Post-operative Plan: Extubation in OR  Informed Consent: I have reviewed the patients History and Physical, chart, labs and discussed the procedure including the risks, benefits and alternatives for the proposed anesthesia with the patient or authorized representative who has indicated his/her understanding and acceptance.     Plan Discussed with: CRNA and Surgeon  Anesthesia Plan Comments:         Anesthesia Quick Evaluation

## 2011-04-13 LAB — BASIC METABOLIC PANEL
CO2: 27 mEq/L (ref 19–32)
Calcium: 8.3 mg/dL — ABNORMAL LOW (ref 8.4–10.5)
Creatinine, Ser: 0.49 mg/dL — ABNORMAL LOW (ref 0.50–1.10)
GFR calc non Af Amer: >90 mL/min (ref >90)
Sodium: 141 mEq/L (ref 135–145)

## 2011-04-13 LAB — CBC
MCH: 29 pg (ref 26.0–34.0)
MCHC: 33.6 g/dL (ref 30.0–36.0)
MCV: 86.3 fL (ref 78.0–100.0)
Platelets: 228 10*3/uL (ref 150–400)
RDW: 13.3 % (ref 11.5–15.5)

## 2011-04-13 NOTE — Progress Notes (Addendum)
[facility] Progress Note  04/13/2011 7:17 AM POD 1  Subjective:  Up in chair reading with no complaints  On dopamine  Tm 99 Filed Vitals:   04/13/11 0700  BP: 113/53  Pulse: 68  Temp:   Resp: 9     Physical Exam: Neuro:  In tact.  No deficits Incision:  C/d/i without drainage.  No hematoma Lungs:  Non-labored.  CBC    Component Value Date/Time   WBC 6.7 04/13/2011 0433   RBC 4.17 04/13/2011 0433   HGB 12.1 04/13/2011 0433   HCT 36.0 04/13/2011 0433   PLT 228 04/13/2011 0433   MCV 86.3 04/13/2011 0433   MCH 29.0 04/13/2011 0433   MCHC 33.6 04/13/2011 0433   RDW 13.3 04/13/2011 0433   LYMPHSABS 1.2 04/05/2011 1027   MONOABS 0.4 04/05/2011 1027   EOSABS 0.0 04/05/2011 1027   BASOSABS 0.0 04/05/2011 1027    BMET    Component Value Date/Time   NA 141 04/13/2011 0433   K 3.4* 04/13/2011 0433   CL 107 04/13/2011 0433   CO2 27 04/13/2011 0433   GLUCOSE 126* 04/13/2011 0433   BUN 13 04/13/2011 0433   CREATININE 0.49* 04/13/2011 0433   CREATININE 0.75 03/02/2011 1546   CALCIUM 8.3* 04/13/2011 0433   GFRNONAA >90 04/13/2011 0433   GFRAA >90 04/13/2011 0433       Assessment/Plan:  This is a 77 y.o. female who is s/p left CEA POD 1  -pt is doing well this am. -wean dopamine.  Hold BP meds until BP tolerates. -Pt needs to void and ambulate -probably home later today  Newton Pigg, PA-C [facility] [phone]  I have reviewed and agree with above.D/C home today after off dopamine  Maxi Rodas F, MD 04/13/2011 7:47 AM

## 2011-04-13 NOTE — Progress Notes (Signed)
Patient being discharged home per MD order. All discharge instructions given and explained to patient. Patient went home with family.

## 2011-04-13 NOTE — Discharge Summary (Signed)
[facility] Discharge Summary  Rachel Mcguire 08-09-1931 77 y.o. female  [id number]  Admission Date: 04/12/2011  Discharge Date: 04/13/11  Physician: Larina Earthly, MD  Admission Diagnosis: Left ICA Stenosis   HPI:   This is a 77 y.o. female who presented for evaluation of asymptomatic left carotid stenosis. She had been seen in a carotid screening study and was found to have significant left carotid disease. She is seen today for further evaluation. She denies any prior amaurosis fugax transient ischemic attack or stroke. She is quite active without any major physical limitation. He does not have any significant cardiac disease.  Hospital Course:  The patient was admitted to the hospital and taken to the operating room on 04/12/2011 and underwent left CEA.  The pt tolerated the procedure well and was transported to the PACU in good condition. She did require a dopamine gtt postoperatively.  Her BP meds were held until BP would tolerate.  Her neuro exam is in tact on POD 1.  Otherwise, the remainder of the hospital course consisted of increasing ambulation and increasing intake of solids without difficulty.    Basename 04/13/11 0433  NA 141  K 3.4*  CL 107  CO2 27  GLUCOSE 126*  BUN 13  CALCIUM 8.3*    Basename 04/13/11 0433  WBC 6.7  HGB 12.1  HCT 36.0  PLT 228    Discharge Instructions:   The patient is discharged to home with extensive instructions on wound care and progressive ambulation.  They are instructed not to drive or perform any heavy lifting until returning to see the physician in his office.  Discharge Orders    Future Appointments: Provider: Department: Dept Phone: Center:   05/11/2011 11:00 AM Larina Earthly, MD [facility] [phone] [facility]     Future Orders Please Complete By Expires   Resume previous diet      Lifting restrictions      Comments:   No lifting for 4 weeks   Call MD for:  temperature >100.5      Call MD for:   redness, tenderness, or signs of infection (pain, swelling, bleeding, redness, odor or green/yellow discharge around incision site)      Call MD for:  severe or increased pain, loss or decreased feeling  in affected limb(s)      Increase activity slowly      Comments:   Walk with assistance use walker or cane as needed   May shower       Scheduling Instructions:   wednesday   Driving Restrictions      Comments:   No driving for 2 weeks   No dressing needed      CAROTID Sugery: Call MD for difficulty swallowing or speaking; weakness in arms or legs that is a new symtom; severe headache.  If you have increased swelling in the neck and/or  are having difficulty breathing, CALL 911      Resume previous diet      Driving Restrictions      Comments:   No driving for 2 weeks   Lifting restrictions      Comments:   No lifting for 4 weeks   Call MD for:  temperature >100.5      Call MD for:  redness, tenderness, or signs of infection (pain, swelling, bleeding, redness, odor or green/yellow discharge around incision site)      Call MD for:  severe or increased pain, loss or decreased feeling  in affected limb(s)      CAROTID Sugery: Call MD for difficulty swallowing or speaking; weakness in arms or legs that is a new symtom; severe headache.  If you have increased swelling in the neck and/or  are having difficulty breathing, CALL 911      Increase activity slowly      Comments:   Walk with assistance use walker or cane as needed   May shower       Scheduling Instructions:   Shower daily with soap and water starting on Thursday 12/13   may wash over wound with mild soap and water        Discharge Diagnosis:  Left ICA Stenosis  Secondary Diagnosis: Patient Active Problem List  Diagnoses  . Occlusion and stenosis of carotid artery without mention of cerebral infarction   Past Medical History  Diagnosis Date  . Hypertension   . Hyperlipidemia   . Osteoporosis   . Hyperglycemia   .  Carotid artery occlusion   . Uterine fibroid   . Overactive bladder   . Arthritis     both hands  . Peripheral vascular disease   . Cancer SKIN CANCERS REMOVED FROM [facility] KNEE        [name], [name]  Home Medication Instructions [id number]   Printed on:04/13/11 0734  Medication Information                    hydrochlorothiazide 25 MG tablet Take 25 mg by mouth daily.             multivitamin (THERAGRAN) per tablet Take 1 tablet by mouth daily.             vitamin C (ASCORBIC ACID) 500 MG tablet Take 500 mg by mouth daily.             Coenzyme Q10 (CO Q 10) 100 MG CAPS Take 1 capsule by mouth daily.            aspirin 81 MG tablet Take 81 mg by mouth daily.             oxybutynin (DITROPAN-XL) 10 MG 24 hr tablet Take 10 mg by mouth 2 (two) times daily.            fish oil-omega-3 fatty acids 1000 MG capsule Take 2 g by mouth daily.             Cholecalciferol (VITAMIN D3) 1000 UNITS CAPS Take 1,000 Units by mouth daily.             Red Yeast Rice 600 MG CAPS Take 2 capsules by mouth daily.             amLODipine-benazepril (LOTREL) 5-20 MG per capsule Take 2 capsules by mouth daily.            Magnesium 250 MG TABS Take 1 tablet by mouth daily.            oxyCODONE-acetaminophen (ROXICET) 5-325 MG per tablet Take 1-2 tablets by mouth every 4 (four) hours as needed for pain.             Disposition: home  Patient's condition: is Good   Newton Pigg, PA-C [facility] [phone] 04/13/2011  7:34 AM

## 2011-04-15 ENCOUNTER — Encounter (HOSPITAL_COMMUNITY): Payer: Self-pay | Admitting: Vascular Surgery

## 2011-05-10 ENCOUNTER — Encounter: Payer: Self-pay | Admitting: Vascular Surgery

## 2011-05-11 ENCOUNTER — Ambulatory Visit (INDEPENDENT_AMBULATORY_CARE_PROVIDER_SITE_OTHER): Payer: Medicare Other | Admitting: Vascular Surgery

## 2011-05-11 ENCOUNTER — Encounter: Payer: Self-pay | Admitting: Vascular Surgery

## 2011-05-11 VITALS — BP 151/59 | HR 80 | Resp 18 | Ht 64.0 in | Wt 134.4 lb

## 2011-05-11 DIAGNOSIS — I6529 Occlusion and stenosis of unspecified carotid artery: Secondary | ICD-10-CM

## 2011-05-11 NOTE — Progress Notes (Signed)
The patient presents today for followup of her left carotid endarterectomy and Dacron patch angioplasty on 04/12/2011. She did well in the hospital and was discharged home on postoperative day #1. She's had no neurologic deficits and has had no significant discomfort following her surgery. He does report the typical. Incisional numbness.  Physical exam: Well-healed left neck incision with no bruits bilaterally. She is grossly intact neurologically.  Impression and plan: Table recovery following left carotid surgery. We will see her again in 6 months with repeat carotid duplex for followup of her left endarterectomy and right 40-60% carotid stenosis. She'll notify should she develop any neurologic deficits or wound problems

## 2011-06-15 ENCOUNTER — Other Ambulatory Visit: Payer: Self-pay | Admitting: Dermatology

## 2011-11-08 ENCOUNTER — Encounter: Payer: Self-pay | Admitting: Neurosurgery

## 2011-11-09 ENCOUNTER — Ambulatory Visit (INDEPENDENT_AMBULATORY_CARE_PROVIDER_SITE_OTHER): Payer: Medicare Other | Admitting: Neurosurgery

## 2011-11-09 ENCOUNTER — Ambulatory Visit (INDEPENDENT_AMBULATORY_CARE_PROVIDER_SITE_OTHER): Payer: Medicare Other | Admitting: *Deleted

## 2011-11-09 ENCOUNTER — Encounter: Payer: Self-pay | Admitting: Neurosurgery

## 2011-11-09 VITALS — BP 138/72 | HR 66 | Resp 14 | Ht 62.75 in | Wt 121.2 lb

## 2011-11-09 DIAGNOSIS — Z48812 Encounter for surgical aftercare following surgery on the circulatory system: Secondary | ICD-10-CM

## 2011-11-09 DIAGNOSIS — I6529 Occlusion and stenosis of unspecified carotid artery: Secondary | ICD-10-CM

## 2011-11-09 NOTE — Progress Notes (Signed)
[facility] [place] Carotid Office Note  CC: Annual carotid duplex Referring Physician: Early  History of Present Illness: 78 year old female patient of Dr. Arbie Cookey status post left CEA in December 2012. The patient reports no signs or symptoms of CVA, TIA, amaurosis fugax or any neural deficit. Patient also denies any new medical diagnoses or recent surgeries.  Past Medical History  Diagnosis Date  . Hypertension   . Hyperlipidemia   . Osteoporosis   . Hyperglycemia   . Carotid artery occlusion   . Uterine fibroid   . Overactive bladder   . Arthritis     both hands  . Peripheral vascular disease   . Cancer SKIN CANCERS REMOVED FROM FACE LEG KNEE     ROS: [x]  Positive   [ ]  Denies    General: [ ]  Weight loss, [ ]  Fever, [ ]  chills Neurologic: [ ]  Dizziness, [ ]  Blackouts, [ ]  Seizure [ ]  Stroke, [ ]  "Mini stroke", [ ]  Slurred speech, [ ]  Temporary blindness; [ ]  weakness in arms or legs, [ ]  Hoarseness Cardiac: [ ]  Chest pain/pressure, [ ]  Shortness of breath at rest [ ]  Shortness of breath with exertion, [ ]  Atrial fibrillation or irregular heartbeat Vascular: [ ]  Pain in legs with walking, [ ]  Pain in legs at rest, [ ]  Pain in legs at night,  [ ]  Non-healing ulcer, [ ]  Blood clot in vein/DVT,   Pulmonary: [ ]  Home oxygen, [ ]  Productive cough, [ ]  Coughing up blood, [ ]  Asthma,  [ ]  Wheezing Musculoskeletal:  [ ]  Arthritis, [ ]  Low back pain, [ ]  Joint pain Hematologic: [ ]  Easy Bruising, [ ]  Anemia; [ ]  Hepatitis Gastrointestinal: [ ]  Blood in stool, [ ]  Gastroesophageal Reflux/heartburn, [ ]  Trouble swallowing Urinary: [ ]  chronic Kidney disease, [ ]  on HD - [ ]  MWF or [ ]  TTHS, [ ]  Burning with urination, [ ]  Difficulty urinating Skin: [ ]  Rashes, [ ]  Wounds Psychological: [ ]  Anxiety, [ ]  Depression   Social History History  Substance Use Topics  . Smoking status: Never Smoker   . Smokeless tobacco: Never Used  . Alcohol Use: No    Family  History Family History  Problem Relation Age of Onset  . Heart disease Father   . Stroke Father   . Breast cancer Paternal Grandmother     Allergies  Allergen Reactions  . Penicillins Itching and Rash    Current Outpatient Prescriptions  Medication Sig Dispense Refill  . amLODipine-benazepril (LOTREL) 5-20 MG per capsule Take 2 capsules by mouth daily.       Marland Kitchen aspirin 81 MG tablet Take 81 mg by mouth daily.        Marland Kitchen atorvastatin (LIPITOR) 10 MG tablet Take 10 mg by mouth daily.      . Cholecalciferol (VITAMIN D3) 1000 UNITS CAPS Take 1,000 Units by mouth daily.        . Coenzyme Q10 (CO Q 10) 100 MG CAPS Take 200 mg by mouth daily.       . fish oil-omega-3 fatty acids 1000 MG capsule Take 2 g by mouth daily.        . hydrochlorothiazide 25 MG tablet Take 25 mg by mouth daily.        . Magnesium 250 MG TABS Take 1 tablet by mouth daily.       . multivitamin (THERAGRAN) per tablet Take 1 tablet by mouth daily.        Marland Kitchen  oxybutynin (DITROPAN-XL) 10 MG 24 hr tablet Take 10 mg by mouth 2 (two) times daily.       . vitamin C (ASCORBIC ACID) 500 MG tablet Take 500 mg by mouth daily.        . Red Yeast Rice 600 MG CAPS Take 2 capsules by mouth daily.         Current Facility-Administered Medications  Medication Dose Route Frequency Provider Last Rate Last Dose  . 0.9 %  sodium chloride infusion  500 mL Intravenous Continuous Iva Boop, MD        Physical Examination  Filed Vitals:   11/09/11 1103  BP: 138/72  Pulse: 66  Resp:     Body mass index is 21.64 kg/(m^2).  General:  WDWN in NAD Gait: Normal HEENT: WNL Eyes: Pupils equal Pulmonary: normal non-labored breathing , without Rales, rhonchi,  wheezing Cardiac: RRR, without  Murmurs, rubs or gallops; Abdomen: soft, NT, no masses Skin: no rashes, ulcers noted  Vascular Exam Pulses: 2+ radial pulses bilaterally Carotid bruits: Carotid pulses to auscultation no bruits are heard Extremities without ischemic changes,  no Gangrene , no cellulitis; no open wounds;  Musculoskeletal: no muscle wasting or atrophy   Neurologic: A&O X 3; Appropriate Affect ; SENSATION: normal; MOTOR FUNCTION:  moving all extremities equally. Speech is fluent/normal  Non-Invasive Vascular Imaging CAROTID DUPLEX 11/09/2011  Right ICA 40 - 59 % stenosis Left ICA 0 - 19% stenosis   ASSESSMENT/PLAN: Asymptomatic patient status post left CEA December 2012 , the patient will followup in 6 months for repeat carotid duplex. Her questions were encouraged and answered she is in agreement with this plan.  Lauree Chandler ANP   Clinic MD: [facility]

## 2011-11-09 NOTE — Addendum Note (Signed)
Addended by: Sharee Pimple on: 11/09/2011 11:43 AM   Modules accepted: Orders

## 2011-11-16 NOTE — Procedures (Unsigned)
CAROTID DUPLEX EXAM  INDICATION:  Follow up carotid endarterectomy.  HISTORY: Diabetes:  No. Cardiac:  No. Hypertension:  Yes. Smoking:  No. Previous Surgery:  Left CEA 04/12/2011 with Dacron patch angioplasty performed by Dr. Arbie Cookey. CV History:  Currently asymptomatic. Amaurosis Fugax No, Paresthesias No, Hemiparesis No                                      RIGHT             LEFT Brachial systolic pressure:         150               138 Brachial Doppler waveforms:         Normal            Normal Vertebral direction of flow:        Antegrade         Antegrade DUPLEX VELOCITIES (cm/sec) CCA peak systolic                   63                84 ECA peak systolic                   74                100 ICA peak systolic                   124               80 ICA end diastolic                   39                29 PLAQUE MORPHOLOGY:                  Mixed             Intimal wall thickening PLAQUE AMOUNT:                      Minimal/moderate  Minimal PLAQUE LOCATION:                    CCA, ICA          CCA  IMPRESSION: 1. Right internal carotid artery velocity suggests 40-59% stenosis. 2. Patent left carotid endarterectomy site without evidence of re-     stenosis of the internal carotid artery. 3. Antegrade vertebral arteries bilaterally.  ___________________________________________ Larina Earthly, M.D.  EM/MEDQ  D:  11/09/2011  T:  11/09/2011  Job:  [id number]

## 2011-12-20 ENCOUNTER — Other Ambulatory Visit: Payer: Self-pay | Admitting: Dermatology

## 2012-11-14 ENCOUNTER — Ambulatory Visit: Payer: Medicare Other | Admitting: Neurosurgery

## 2012-11-14 ENCOUNTER — Other Ambulatory Visit (INDEPENDENT_AMBULATORY_CARE_PROVIDER_SITE_OTHER): Payer: Medicare Other | Admitting: Vascular Surgery

## 2012-11-14 DIAGNOSIS — Z48812 Encounter for surgical aftercare following surgery on the circulatory system: Secondary | ICD-10-CM

## 2012-11-14 DIAGNOSIS — I6529 Occlusion and stenosis of unspecified carotid artery: Secondary | ICD-10-CM

## 2012-11-15 ENCOUNTER — Other Ambulatory Visit: Payer: Self-pay | Admitting: *Deleted

## 2012-11-15 DIAGNOSIS — Z48812 Encounter for surgical aftercare following surgery on the circulatory system: Secondary | ICD-10-CM

## 2012-11-23 ENCOUNTER — Encounter: Payer: Self-pay | Admitting: Vascular Surgery

## 2013-02-05 IMAGING — CR DG CHEST 2V
2 series · 2 of 2 positions shown · non-contrast
Comparison: None.

CLINICAL DATA: Preop

CHEST - 2 VIEW

[view not recorded (1 of 2)]
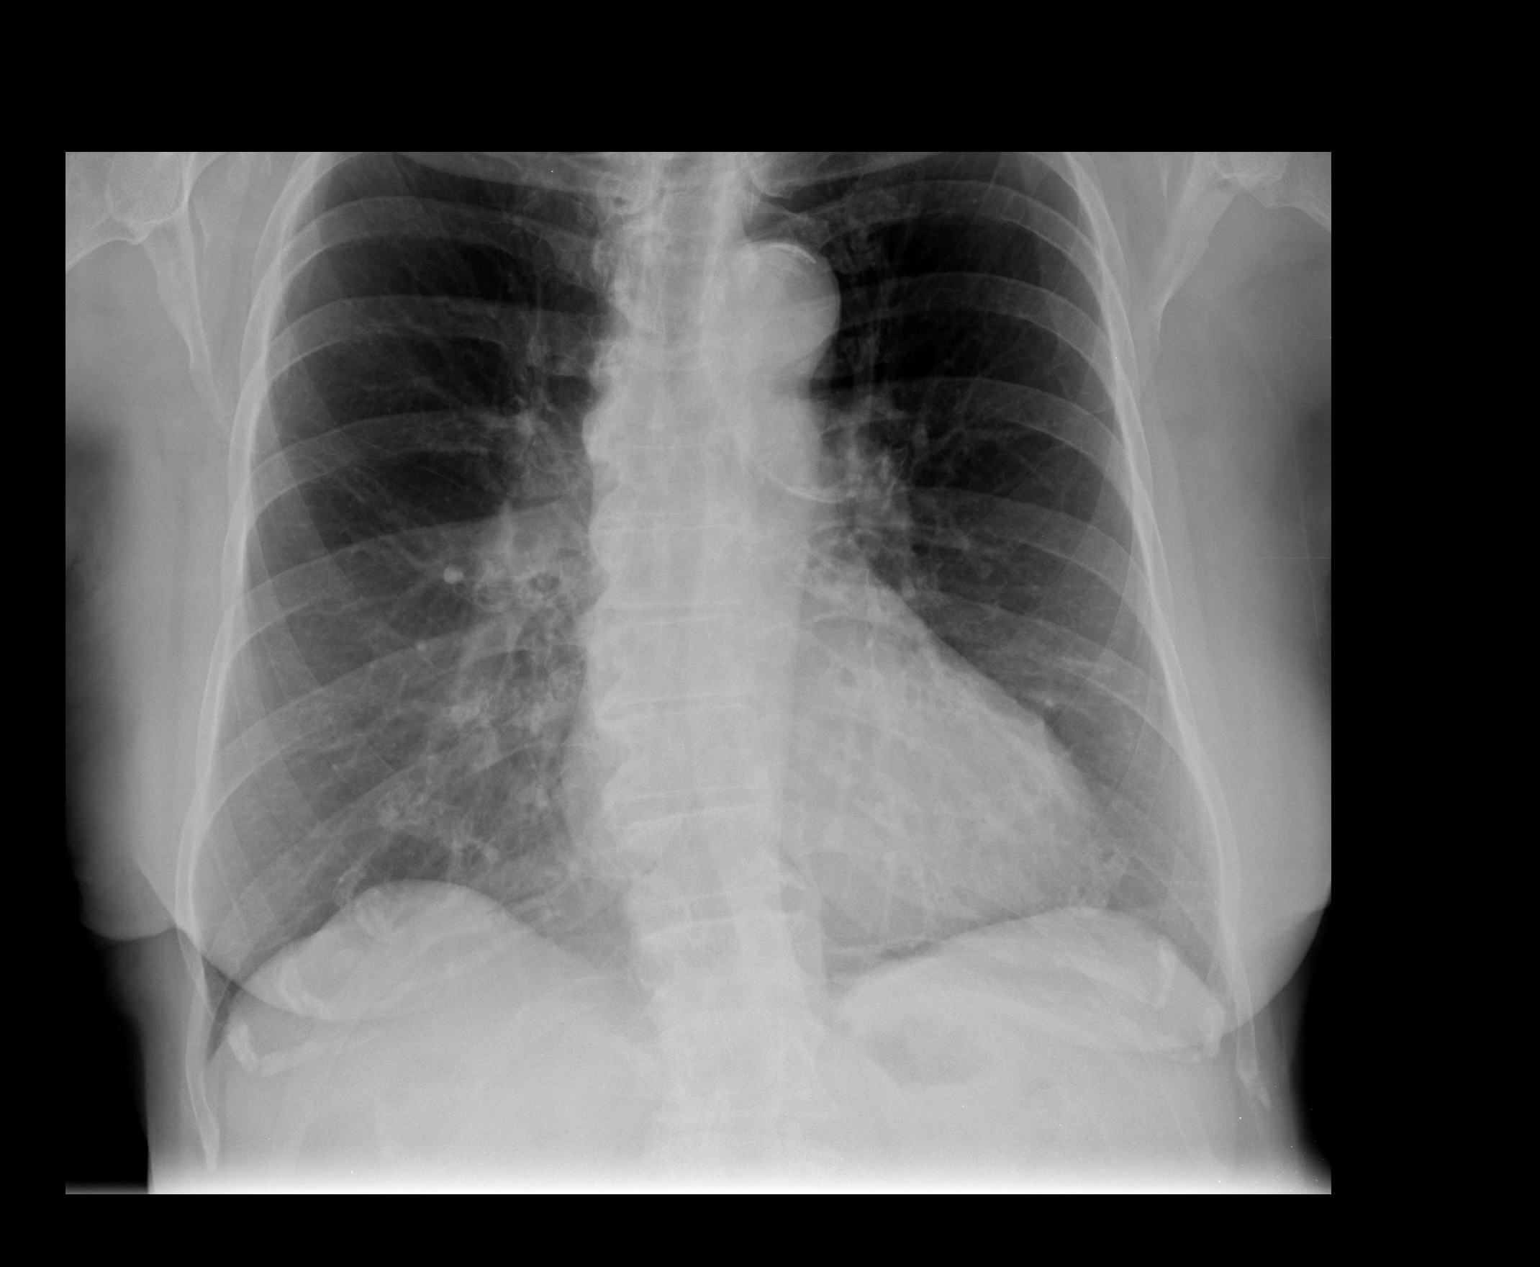

[view not recorded (2 of 2)]
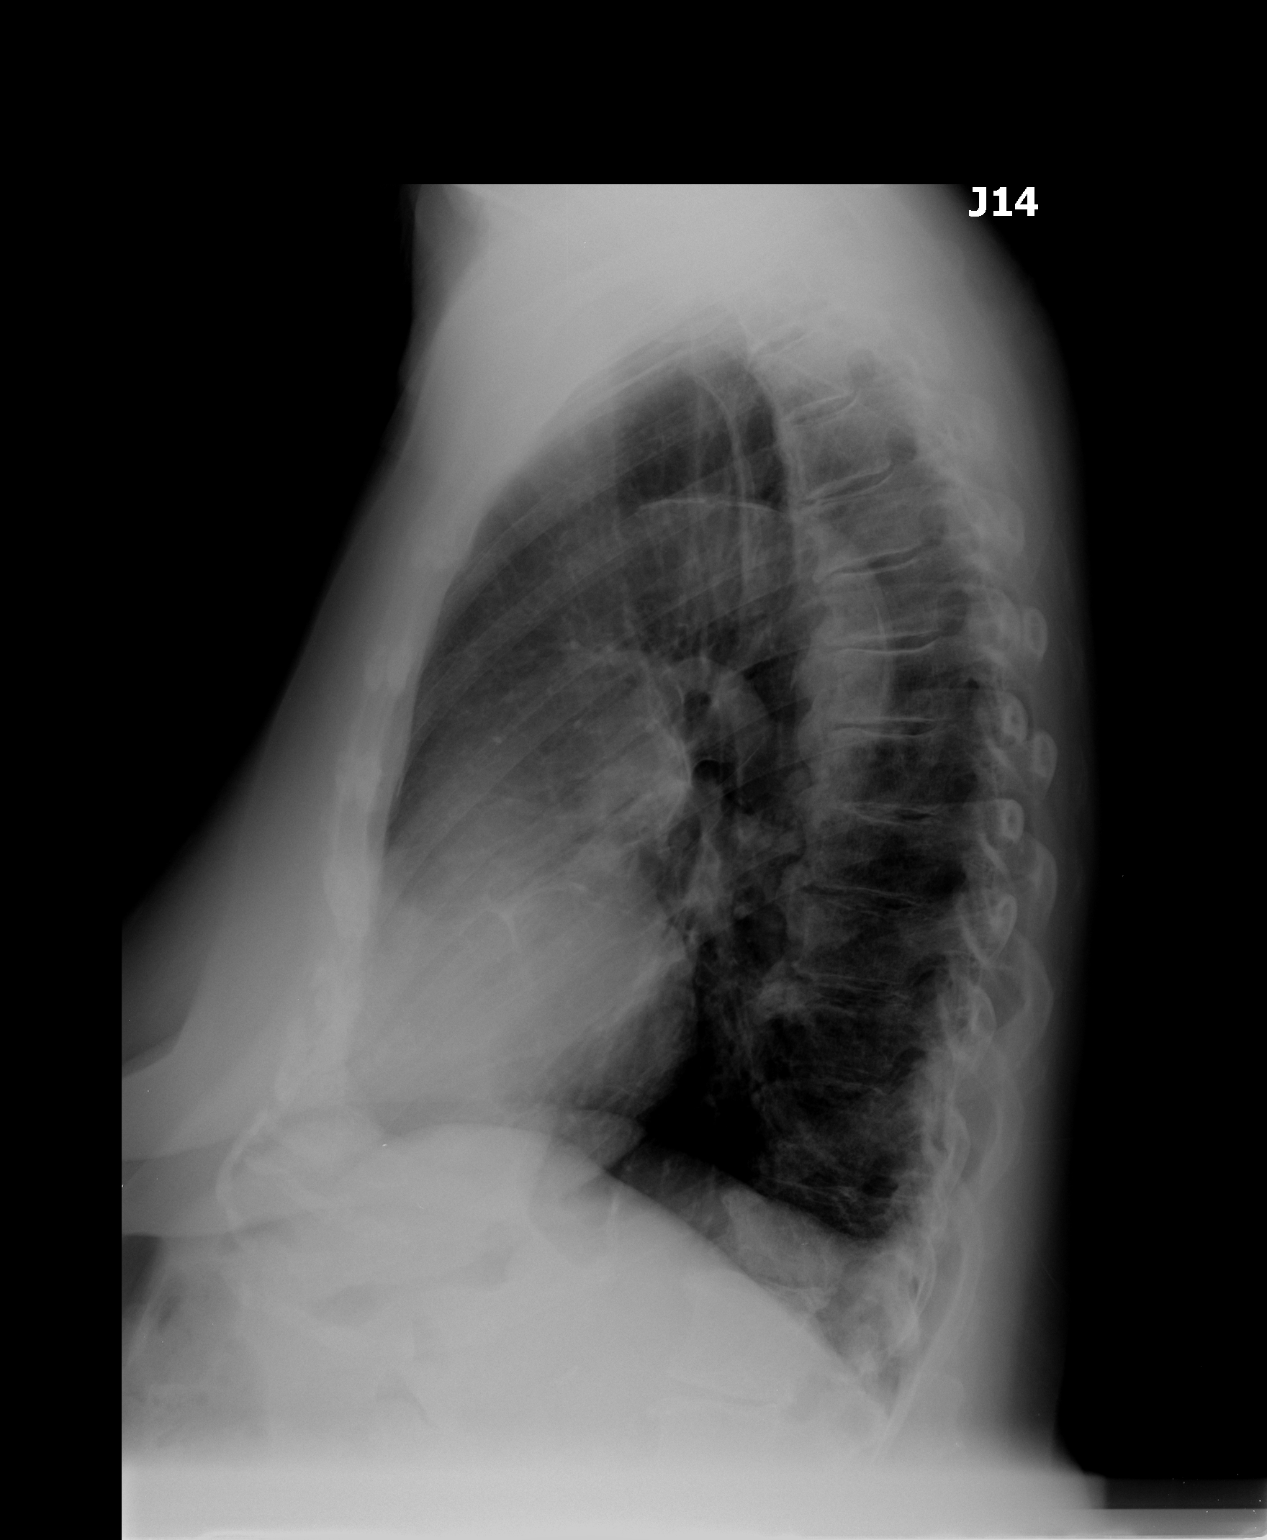

[2 of 2 positions shown; findings below may reference images not displayed]

FINDINGS: Cardiomediastinal silhouette is unremarkable.
Atherosclerotic calcifications of thoracic aorta are noted.  There
is mild dextroscoliosis and degenerative changes of thoracic spine
are noted.  No acute infiltrate or pleural effusion.  No pulmonary
edema.
IMPRESSION: No active disease.  Mild dextroscoliosis.  Degenerative changes
thoracic spine.

## 2013-06-19 ENCOUNTER — Other Ambulatory Visit: Payer: Self-pay | Admitting: Vascular Surgery

## 2013-06-19 DIAGNOSIS — I6529 Occlusion and stenosis of unspecified carotid artery: Secondary | ICD-10-CM

## 2013-06-19 DIAGNOSIS — Z48812 Encounter for surgical aftercare following surgery on the circulatory system: Secondary | ICD-10-CM

## 2013-11-19 ENCOUNTER — Encounter: Payer: Self-pay | Admitting: Family

## 2013-11-20 ENCOUNTER — Ambulatory Visit: Payer: Medicare Other | Admitting: Vascular Surgery

## 2013-11-20 ENCOUNTER — Ambulatory Visit (HOSPITAL_COMMUNITY)
Admission: RE | Admit: 2013-11-20 | Discharge: 2013-11-20 | Disposition: A | Discharge status: Home / Self Care | Payer: Medicare Other | Source: Ambulatory Visit | Attending: Family | Admitting: Family

## 2013-11-20 ENCOUNTER — Other Ambulatory Visit (HOSPITAL_COMMUNITY): Payer: Medicare Other

## 2013-11-20 ENCOUNTER — Ambulatory Visit (INDEPENDENT_AMBULATORY_CARE_PROVIDER_SITE_OTHER): Payer: 59 | Admitting: Family

## 2013-11-20 ENCOUNTER — Encounter: Payer: Self-pay | Admitting: Family

## 2013-11-20 VITALS — BP 132/73 | HR 60 | Resp 16 | Ht 63.0 in | Wt 112.0 lb

## 2013-11-20 DIAGNOSIS — Z48812 Encounter for surgical aftercare following surgery on the circulatory system: Secondary | ICD-10-CM

## 2013-11-20 DIAGNOSIS — I6529 Occlusion and stenosis of unspecified carotid artery: Secondary | ICD-10-CM

## 2013-11-20 NOTE — Addendum Note (Signed)
Addended by: Mena Goes on: 11/20/2013 03:52 PM   Modules accepted: Orders

## 2013-11-20 NOTE — Progress Notes (Signed)
Established Carotid Patient   History of Present Illness  Rachel Mcguire is a 80 y.o. female patient of Dr. Donnetta Mcguire who is status post left CEA in December 2012. She returns today for follow up.  Patient has Negative history of TIA or stroke symptom.  The patient denies amaurosis fugax or monocular blindness.  The patient  denies facial drooping.  Pt. denies hemiplegia.  The patient denies receptive or expressive aphasia.  Pt. denies extremity weakness other than due to arthritis in both hands. Pt denies claudication symptoms with walking, denies non healing wounds.    Pt denies New Medical or Surgical History.  Pt Diabetic: No Pt smoker: non-smoker  Pt meds include: Statin : Yes ASA: Yes Other anticoagulants/antiplatelets: no   Past Medical History  Diagnosis Date  . Hypertension   . Hyperlipidemia   . Osteoporosis   . Hyperglycemia   . Carotid artery occlusion   . Uterine fibroid   . Overactive bladder   . Arthritis     both hands  . Peripheral vascular disease   . Cancer SKIN CANCERS REMOVED FROM FACE LEG KNEE     Social History History  Substance Use Topics  . Smoking status: Never Smoker   . Smokeless tobacco: Never Used  . Alcohol Use: No    Family History Family History  Problem Relation Age of Onset  . Heart disease Father   . Stroke Father   . Breast cancer Paternal Grandmother     Surgical History Past Surgical History  Procedure Laterality Date  . Colonoscopy  2001    mild diverticulosis  . Diagnostic laparoscopy      for ovarian pain  . Endarterectomy  04/12/2011    Procedure: ENDARTERECTOMY CAROTID;  Surgeon: Rosetta Posner, MD;  Location: [facility] OR;  Service: Vascular;  Laterality: Left;  . Carotid endarterectomy  04/12/11    Left CEA    Allergies  Allergen Reactions  . Penicillins Itching and Rash    Current Outpatient Prescriptions  Medication Sig Dispense Refill  . amLODipine-benazepril (LOTREL) 5-20 MG per capsule Take 2 capsules  by mouth daily.       Marland Kitchen aspirin 81 MG tablet Take 81 mg by mouth daily.        Marland Kitchen atorvastatin (LIPITOR) 10 MG tablet Take 10 mg by mouth daily.      . Cholecalciferol (VITAMIN D3) 1000 UNITS CAPS Take 1,000 Units by mouth daily.        . Coenzyme Q10 (CO Q 10) 100 MG CAPS Take 200 mg by mouth daily.       . fish oil-omega-3 fatty acids 1000 MG capsule Take 2 g by mouth daily.        . hydrochlorothiazide 25 MG tablet Take 25 mg by mouth daily.        . Magnesium 250 MG TABS Take 1 tablet by mouth daily.       . multivitamin (THERAGRAN) per tablet Take 1 tablet by mouth daily.        Marland Kitchen oxybutynin (DITROPAN-XL) 10 MG 24 hr tablet Take 10 mg by mouth 2 (two) times daily.       . Red Yeast Rice 600 MG CAPS Take 2 capsules by mouth daily.        . vitamin C (ASCORBIC ACID) 500 MG tablet Take 500 mg by mouth daily.         Current Facility-Administered Medications  Medication Dose Route Frequency Provider Last Rate Last Dose  . 0.9 %  sodium chloride infusion  500 mL Intravenous Continuous Gatha Mayer, MD        Review of Systems : See HPI for pertinent positives and negatives.  Physical Examination  There were no vitals filed for this visit.  General: WDWN female in NAD GAIT: normal Eyes: PERRLA Pulmonary:  Non-labored, CTAB, Negative  Rales, Negative rhonchi, & Negative wheezing.  Cardiac: regular Rhythm ,  Negative detected murmur.  VASCULAR EXAM Carotid Bruits Left Right   Negative Negative     Radial pulses are 2+ palpable and equal.                                                                                                                            LE Pulses LEFT RIGHT       POPLITEAL  not palpable   not palpable       DORSALIS PEDIS      ANTERIOR TIBIAL  palpable   palpable     Gastrointestinal: soft, nontender, BS WNL, no r/g,  negative masses.  Musculoskeletal: Negative muscle atrophy/wasting. M/S 5/5 throughout, Extremities without ischemic  changes.  Neurologic: A&O X 3; Appropriate Affect ; SENSATION ;normal;  Speech is normal CN 2-12 intact, Pain and light touch intact in extremities, Motor exam as listed above.   Non-Invasive Vascular Imaging CAROTID DUPLEX 11/20/2013   CEREBROVASCULAR DUPLEX EVALUATION    INDICATION: Carotid artery disease     PREVIOUS INTERVENTION(S): Left carotid endarterectomy 04/12/2011.    DUPLEX EXAM:     RIGHT  LEFT  Peak Systolic Velocities (cm/s) End Diastolic Velocities (cm/s) Plaque LOCATION Peak Systolic Velocities (cm/s) End Diastolic Velocities (cm/s) Plaque  101 17  CCA PROXIMAL 66 9   53 10  CCA MID 64 10   55 14 HT CCA DISTAL 63 11   62 3 HT ECA 58 3   131 39 HT ICA PROXIMAL 19 5   114 32  ICA MID 67 16   92 25  ICA DISTAL 55 13     2.47 ICA / CCA Ratio (PSV)   Antegrade  Vertebral Flow Antegrade   482 Brachial Systolic Pressure (mmHg) 500  Triphasic  Brachial Artery Waveforms Triphasic     Plaque Morphology:  HM = Homogeneous, HT = Heterogeneous, CP = Calcific Plaque, SP = Smooth Plaque, IP = Irregular Plaque     ADDITIONAL FINDINGS:     IMPRESSION: Right internal carotid artery velocities suggest a <40% stenosis (high end of range). Patent left carotid endarterectomy site with no evidence of hyperplasia or restenosis.     Compared to the previous exam:  No significant change in comparison to the last exam on 11/14/2012.     Assessment: [name] is a 80 y.o. female who is status post left CEA in December 2012. She presents with asymptomatic minimal right ICA stenosis and a patent left ICA which is the endarterectomy site. No significant change in comparison to the last exam on 11/14/2012.  Plan: Follow-up in 1 year with Carotid Duplex scan.   I discussed in depth with the patient the nature of atherosclerosis, and emphasized the importance of maximal medical management including strict control of blood pressure, blood glucose, and lipid levels, obtaining  regular exercise, and continued cessation of smoking.  The patient is aware that without maximal medical management the underlying atherosclerotic disease process will progress, limiting the benefit of any interventions. The patient was given information about stroke prevention and what symptoms should prompt the patient to seek immediate medical care. Thank you for allowing [place] to participate in this patient's care.  Clemon Chambers, RN, MSN, FNP-C [facility] [place] Office: [phone]  Clinic Physician: Oneida Alar  11/20/2013 10:33 AM

## 2013-11-20 NOTE — Patient Instructions (Signed)
Stroke Prevention Some medical conditions and behaviors are associated with an increased chance of having a stroke. You may prevent a stroke by making healthy choices and managing medical conditions. HOW CAN I REDUCE MY RISK OF HAVING A STROKE?   Stay physically active. Get at least 30 minutes of activity on most or all days.  Do not smoke. It may also be helpful to avoid exposure to secondhand smoke.  Limit alcohol use. Moderate alcohol use is considered to be:  No more than 2 drinks per day for men.  No more than 1 drink per day for nonpregnant women.  Eat healthy foods. This involves  Eating 5 or more servings of fruits and vegetables a day.  Following a diet that addresses high blood pressure (hypertension), high cholesterol, diabetes, or obesity.  Manage your cholesterol levels.  A diet low in saturated fat, trans fat, and cholesterol and high in fiber may control cholesterol levels.  Take any prescribed medicines to control cholesterol as directed by your health care provider.  Manage your diabetes.  A controlled-carbohydrate, controlled-sugar diet is recommended to manage diabetes.  Take any prescribed medicines to control diabetes as directed by your health care provider.  Control your hypertension.  A low-salt (sodium), low-saturated fat, low-trans fat, and low-cholesterol diet is recommended to manage hypertension.  Take any prescribed medicines to control hypertension as directed by your health care provider.  Maintain a healthy weight.  A reduced-calorie, low-sodium, low-saturated fat, low-trans fat, low-cholesterol diet is recommended to manage weight.  Stop drug abuse.  Avoid taking birth control pills.  Talk to your health care provider about the risks of taking birth control pills if you are over 35 years old, smoke, get migraines, or have ever had a blood clot.  Get evaluated for sleep disorders (sleep apnea).  Talk to your health care provider about  getting a sleep evaluation if you snore a lot or have excessive sleepiness.  Take medicines as directed by your health care provider.  For some people, aspirin or blood thinners (anticoagulants) are helpful in reducing the risk of forming abnormal blood clots that can lead to stroke. If you have the irregular heart rhythm of atrial fibrillation, you should be on a blood thinner unless there is a good reason you cannot take them.  Understand all your medicine instructions.  Make sure that other other conditions (such as anemia or atherosclerosis) are addressed. SEEK IMMEDIATE MEDICAL CARE IF:   You have sudden weakness or numbness of the face, arm, or leg, especially on one side of the body.  Your face or eyelid droops to one side.  You have sudden confusion.  You have trouble speaking (aphasia) or understanding.  You have sudden trouble seeing in one or both eyes.  You have sudden trouble walking.  You have dizziness.  You have a loss of balance or coordination.  You have a sudden, severe headache with no known cause.  You have new chest pain or an irregular heartbeat. Any of these symptoms may represent a serious problem that is an emergency. Do not wait to see if the symptoms will go away. Get medical help at once. Call your local emergency services  (911 in [place] Do not drive yourself to the hospital. Document Released: 05/27/2004 Document Revised: 02/07/2013 Document Reviewed: 10/20/2012 Exit[employer] Patient Information 2015 [employer]. This information is not intended to replace advice given to you by your health care provider. Make sure you discuss any questions you have with your health   care provider.  

## 2013-12-08 ENCOUNTER — Encounter: Payer: Self-pay | Admitting: *Deleted

## 2014-01-18 ENCOUNTER — Other Ambulatory Visit: Payer: Self-pay | Admitting: Dermatology

## 2014-11-28 ENCOUNTER — Encounter: Payer: Self-pay | Admitting: Family

## 2014-12-03 ENCOUNTER — Ambulatory Visit (HOSPITAL_COMMUNITY)
Admission: RE | Admit: 2014-12-03 | Discharge: 2014-12-03 | Disposition: A | Discharge status: Home / Self Care | Payer: Medicare Other | Source: Ambulatory Visit | Attending: Family | Admitting: Family

## 2014-12-03 ENCOUNTER — Other Ambulatory Visit: Payer: Self-pay | Admitting: Family

## 2014-12-03 ENCOUNTER — Ambulatory Visit (INDEPENDENT_AMBULATORY_CARE_PROVIDER_SITE_OTHER): Payer: Medicare Other | Admitting: Family

## 2014-12-03 ENCOUNTER — Encounter: Payer: Self-pay | Admitting: Family

## 2014-12-03 VITALS — BP 108/65 | HR 63 | Temp 97.0°F | Resp 16 | Ht 63.0 in | Wt 107.0 lb

## 2014-12-03 DIAGNOSIS — I6523 Occlusion and stenosis of bilateral carotid arteries: Secondary | ICD-10-CM

## 2014-12-03 DIAGNOSIS — Z48812 Encounter for surgical aftercare following surgery on the circulatory system: Secondary | ICD-10-CM

## 2014-12-03 DIAGNOSIS — I6521 Occlusion and stenosis of right carotid artery: Secondary | ICD-10-CM | POA: Diagnosis not present

## 2014-12-03 DIAGNOSIS — Z9889 Other specified postprocedural states: Secondary | ICD-10-CM

## 2014-12-03 NOTE — Progress Notes (Signed)
Established Carotid Patient   History of Present Illness  Rachel Mcguire is a 81 y.o. female patient of Dr. Donnetta Hutching who is status post left CEA in December 2012. She returns today for follow up.  Patient has nohistory of TIA or stroke symptoms. The patient denies amaurosis fugax or monocular blindness. The patient denies facial drooping. Pt. denies hemiplegia. The patient denies receptive or expressive aphasia. Pt. denies extremity weakness other than due to arthritis in both hands. Pt denies claudication symptoms with walking, denies non healing wounds.   Pt denies New Medical or Surgical History.  Pt Diabetic: No Pt smoker: non-smoker  Pt meds include: Statin : Yes ASA: Yes Other anticoagulants/antiplatelets: no  Past Medical History  Diagnosis Date  . Hypertension   . Hyperlipidemia   . Osteoporosis   . Hyperglycemia   . Uterine fibroid   . Overactive bladder   . Arthritis     both hands  . Peripheral vascular disease   . Carotid artery occlusion   . Cancer SKIN CANCERS REMOVED FROM FACE LEG KNEE       BCC/SCC  nose skin cancer    Social History History  Substance Use Topics  . Smoking status: Never Smoker   . Smokeless tobacco: Never Used  . Alcohol Use: No    Family History Family History  Problem Relation Age of Onset  . Heart disease Father     Before age 46  . Stroke Father   . Heart attack Father   . Deep vein thrombosis Father   . Hypertension Father   . Breast cancer Paternal Grandmother   . Hypertension Mother   . Cancer Mother     Surgical History Past Surgical History  Procedure Laterality Date  . Colonoscopy  2001    mild diverticulosis  . Diagnostic laparoscopy      for ovarian pain  . Endarterectomy  04/12/2011    Procedure: ENDARTERECTOMY CAROTID;  Surgeon: Rosetta Posner, MD;  Location: [facility] OR;  Service: Vascular;  Laterality: Left;  . Carotid endarterectomy  04/12/11    Left CEA    Allergies  Allergen Reactions  .  Penicillins Itching and Rash    Current Outpatient Prescriptions  Medication Sig Dispense Refill  . alendronate (FOSAMAX) 70 MG tablet     . amLODipine-benazepril (LOTREL) 5-20 MG per capsule Take 2 capsules by mouth daily.     Marland Kitchen aspirin 81 MG tablet Take 81 mg by mouth daily.      Marland Kitchen atorvastatin (LIPITOR) 10 MG tablet Take 10 mg by mouth daily.    . Cholecalciferol (VITAMIN D3) 1000 UNITS CAPS Take 1,000 Units by mouth daily.      . Coenzyme Q10 (CO Q 10) 100 MG CAPS Take 200 mg by mouth daily.     . fish oil-omega-3 fatty acids 1000 MG capsule Take 2 g by mouth daily.      . hydrochlorothiazide 25 MG tablet Take 25 mg by mouth daily.      . Magnesium 250 MG TABS Take 1 tablet by mouth daily.     . multivitamin (THERAGRAN) per tablet Take 1 tablet by mouth daily.      Marland Kitchen oxybutynin (DITROPAN-XL) 10 MG 24 hr tablet Take 10 mg by mouth 2 (two) times daily.     . Red Yeast Rice 600 MG CAPS Take 2 capsules by mouth daily.      . vitamin C (ASCORBIC ACID) 500 MG tablet Take 500 mg by mouth daily.  Current Facility-Administered Medications  Medication Dose Route Frequency Provider Last Rate Last Dose  . 0.9 %  sodium chloride infusion  500 mL Intravenous Continuous Gatha Mayer, MD        Review of Systems : See HPI for pertinent positives and negatives.  Physical Examination  Filed Vitals:   12/03/14 1101 12/03/14 1103  BP: 117/64 108/65  Pulse: 73 63  Temp: 97 F (36.1 C)   Resp: 16   Height: 5\' 3"  (1.6 m)   Weight: 107 lb (48.535 kg)   SpO2: 97%    Body mass index is 18.96 kg/(m^2).  General: WDWN female in NAD GAIT: normal Eyes: PERRLA Pulmonary: Non-labored, CTAB, Negative Rales, Negative rhonchi, & Negative wheezing.  Cardiac: regular Rhythm, no detected murmur.  VASCULAR EXAM Carotid Bruits Left Right   Negative Negative    Radial pulses are 2+ palpable and equal.       LE Pulses LEFT RIGHT   POPLITEAL not palpable  not palpable   DORSALIS PEDIS  ANTERIOR TIBIAL palpable  palpable     Gastrointestinal: soft, nontender, BS WNL, no r/g,no palpable masses.  Musculoskeletal: Negative muscle atrophy/wasting. M/S 5/5 throughout, Extremities without ischemic changes.  Neurologic: A&O X 3; Appropriate Affect, Speech is normal CN 2-12 intact, Pain and light touch intact in extremities, Motor exam as listed above.        Non-Invasive Vascular Imaging CAROTID DUPLEX 12/03/2014   CEREBROVASCULAR DUPLEX EVALUATION    INDICATION: Carotid artery disease     PREVIOUS INTERVENTION(S): Right carotid endarterectomy 11/13/2010.    DUPLEX EXAM:     RIGHT  LEFT  Peak Systolic Velocities (cm/s) End Diastolic Velocities (cm/s) Plaque LOCATION Peak Systolic Velocities (cm/s) End Diastolic Velocities (cm/s) Plaque  95 20  CCA PROXIMAL 102 18   77 20  CCA MID 106 22   89 22  CCA DISTAL 71 16   83 10  ECA 67 9   60 11  ICA PROXIMAL 102 32 CP  116 35  ICA MID 86 27   95 28  ICA DISTAL 76 28     NA ICA / CCA Ratio (PSV) 0.96  Antegrade  Vertebral Flow Antegrade    Brachial Systolic Pressure (mmHg)   Multiphasic (Subclavian artery) Brachial Artery Waveforms Multiphasic (Subclavian artery)    Plaque Morphology:  HM = Homogeneous, HT = Heterogeneous, CP = Calcific Plaque, SP = Smooth Plaque, IP = Irregular Plaque  ADDITIONAL FINDINGS:     IMPRESSION: Patent right carotid endarterectomy site with no evidence of hyperplasia or restenosis.  Left internal carotid artery velocities suggest a <40% stenosis.     Compared to the previous exam:  No significant change in comparison to the last exam on 11/26/2013.      Assessment: Rachel Mcguire is a 81 y.o. fit appearing female who is status post left CEA in December 2012. She has no  history of stroke or TIA. Today's carotid Duplex suggests a patent right carotid endarterectomy site with no evidence of hyperplasia or restenosis.  Left internal carotid artery velocities suggest a <40% stenosis. No significant change in comparison to the last exam on 11/26/2013.   Plan: Follow-up in 1 year with Carotid Duplex.   I discussed in depth with the patient the nature of atherosclerosis, and emphasized the importance of maximal medical management including strict control of blood pressure, blood glucose, and lipid levels, obtaining regular exercise, and continued cessation of smoking.  The patient is aware that without maximal medical  management the underlying atherosclerotic disease process will progress, limiting the benefit of any interventions. The patient was given information about stroke prevention and what symptoms should prompt the patient to seek immediate medical care. Thank you for allowing [place] to participate in this patient's care.  Clemon Chambers, RN, MSN, FNP-C [facility] Office: [phone]  Clinic Physician: Early  12/03/2014 11:20 AM

## 2015-07-28 ENCOUNTER — Other Ambulatory Visit: Payer: Self-pay | Admitting: Geriatric Medicine

## 2015-07-28 DIAGNOSIS — R269 Unspecified abnormalities of gait and mobility: Secondary | ICD-10-CM

## 2015-08-05 ENCOUNTER — Ambulatory Visit
Admission: RE | Admit: 2015-08-05 | Discharge: 2015-08-05 | Disposition: A | Discharge status: Home / Self Care | Payer: Medicare Other | Source: Ambulatory Visit | Attending: Geriatric Medicine | Admitting: Geriatric Medicine

## 2015-08-05 DIAGNOSIS — R269 Unspecified abnormalities of gait and mobility: Secondary | ICD-10-CM

## 2015-08-05 MED ORDER — GADOBENATE DIMEGLUMINE 529 MG/ML IV SOLN
10.0000 mL | Freq: Once | INTRAVENOUS | Status: AC | PRN
  Administered 2015-08-05: 10 mL via INTRAVENOUS

## 2015-08-25 ENCOUNTER — Encounter: Payer: Self-pay | Admitting: Gastroenterology

## 2015-12-04 ENCOUNTER — Encounter: Payer: Self-pay | Admitting: Family

## 2015-12-09 ENCOUNTER — Ambulatory Visit (INDEPENDENT_AMBULATORY_CARE_PROVIDER_SITE_OTHER): Payer: Medicare Other | Admitting: Family

## 2015-12-09 ENCOUNTER — Ambulatory Visit (HOSPITAL_COMMUNITY)
Admission: RE | Admit: 2015-12-09 | Discharge: 2015-12-09 | Disposition: A | Discharge status: Home / Self Care | Payer: Medicare Other | Source: Ambulatory Visit | Attending: Family | Admitting: Family

## 2015-12-09 ENCOUNTER — Encounter: Payer: Self-pay | Admitting: Family

## 2015-12-09 VITALS — BP 150/81 | HR 75 | Temp 97.4°F | Resp 14 | Ht 59.0 in | Wt 111.0 lb

## 2015-12-09 DIAGNOSIS — Z48812 Encounter for surgical aftercare following surgery on the circulatory system: Secondary | ICD-10-CM | POA: Insufficient documentation

## 2015-12-09 DIAGNOSIS — I10 Essential (primary) hypertension: Secondary | ICD-10-CM | POA: Diagnosis not present

## 2015-12-09 DIAGNOSIS — I6522 Occlusion and stenosis of left carotid artery: Secondary | ICD-10-CM

## 2015-12-09 DIAGNOSIS — E785 Hyperlipidemia, unspecified: Secondary | ICD-10-CM | POA: Insufficient documentation

## 2015-12-09 DIAGNOSIS — Z9889 Other specified postprocedural states: Secondary | ICD-10-CM | POA: Diagnosis not present

## 2015-12-09 NOTE — Patient Instructions (Signed)
Stroke Prevention Some medical conditions and behaviors are associated with an increased chance of having a stroke. You may prevent a stroke by making healthy choices and managing medical conditions. HOW CAN I REDUCE MY RISK OF HAVING A STROKE?   Stay physically active. Get at least 30 minutes of activity on most or all days.  Do not smoke. It may also be helpful to avoid exposure to secondhand smoke.  Limit alcohol use. Moderate alcohol use is considered to be:  No more than 2 drinks per day for men.  No more than 1 drink per day for nonpregnant women.  Eat healthy foods. This involves:  Eating 5 or more servings of fruits and vegetables a day.  Making dietary changes that address high blood pressure (hypertension), high cholesterol, diabetes, or obesity.  Manage your cholesterol levels.  Making food choices that are high in fiber and low in saturated fat, trans fat, and cholesterol may control cholesterol levels.  Take any prescribed medicines to control cholesterol as directed by your health care provider.  Manage your diabetes.  Controlling your carbohydrate and sugar intake is recommended to manage diabetes.  Take any prescribed medicines to control diabetes as directed by your health care provider.  Control your hypertension.  Making food choices that are low in salt (sodium), saturated fat, trans fat, and cholesterol is recommended to manage hypertension.  Ask your health care provider if you need treatment to lower your blood pressure. Take any prescribed medicines to control hypertension as directed by your health care provider.  If you are 18-39 years of age, have your blood pressure checked every 3-5 years. If you are 40 years of age or older, have your blood pressure checked every year.  Maintain a healthy weight.  Reducing calorie intake and making food choices that are low in sodium, saturated fat, trans fat, and cholesterol are recommended to manage  weight.  Stop drug abuse.  Avoid taking birth control pills.  Talk to your health care provider about the risks of taking birth control pills if you are over 35 years old, smoke, get migraines, or have ever had a blood clot.  Get evaluated for sleep disorders (sleep apnea).  Talk to your health care provider about getting a sleep evaluation if you snore a lot or have excessive sleepiness.  Take medicines only as directed by your health care provider.  For some people, aspirin or blood thinners (anticoagulants) are helpful in reducing the risk of forming abnormal blood clots that can lead to stroke. If you have the irregular heart rhythm of atrial fibrillation, you should be on a blood thinner unless there is a good reason you cannot take them.  Understand all your medicine instructions.  Make sure that other conditions (such as anemia or atherosclerosis) are addressed. SEEK IMMEDIATE MEDICAL CARE IF:   You have sudden weakness or numbness of the face, arm, or leg, especially on one side of the body.  Your face or eyelid droops to one side.  You have sudden confusion.  You have trouble speaking (aphasia) or understanding.  You have sudden trouble seeing in one or both eyes.  You have sudden trouble walking.  You have dizziness.  You have a loss of balance or coordination.  You have a sudden, severe headache with no known cause.  You have new chest pain or an irregular heartbeat. Any of these symptoms may represent a serious problem that is an emergency. Do not wait to see if the symptoms will   go away. Get medical help at once. Call your local emergency services (911 in [place] Do not drive yourself to the hospital.   This information is not intended to replace advice given to you by your health care provider. Make sure you discuss any questions you have with your health care provider.   Document Released: 05/27/2004 Document Revised: 05/10/2014 Document Reviewed:  10/20/2012 Elsevier Interactive Patient Education 2016 Elsevier Inc.  

## 2015-12-09 NOTE — Progress Notes (Signed)
Chief Complaint: Follow up Extracranial Carotid Artery Stenosis   History of Present Illness  Rachel Mcguire is a 82 y.o. female patient of Dr. Donnetta Hutching who is status post left CEA in December 2012. She returns today for follow up.  She denies any history of TIA or stroke symptoms.Speciifically she denies a history of amaurosis fugax or monocular blindness, unilateral facial drooping, hemiplegia, or receptive or expressive aphasia.  She denies claudication symptoms with walking, denies non healing wounds.   Pt denies New Medical or Surgical History.  Pt Diabetic: No Pt smoker: non-smoker  Pt meds include: Statin : Yes ASA: Yes Other anticoagulants/antiplatelets: no   Past Medical History:  Diagnosis Date  . Arthritis    both hands  . Cancer ([place]) SKIN CANCERS REMOVED FROM FACE LEG KNEE      BCC/SCC  nose skin cancer  . Carotid artery occlusion   . Hyperglycemia   . Hyperlipidemia   . Hypertension   . Osteoporosis   . Overactive bladder   . Peripheral vascular disease ([place])   . Uterine fibroid     Social History Social History  Substance Use Topics  . Smoking status: Never Smoker  . Smokeless tobacco: Never Used  . Alcohol use No    Family History Family History  Problem Relation Age of Onset  . Heart disease Father     Before age 40  . Stroke Father   . Heart attack Father   . Deep vein thrombosis Father   . Hypertension Father   . Breast cancer Paternal Grandmother   . Hypertension Mother   . Cancer Mother     Surgical History Past Surgical History:  Procedure Laterality Date  . CAROTID ENDARTERECTOMY  04/12/11   Left CEA  . COLONOSCOPY  2001   mild diverticulosis  . DIAGNOSTIC LAPAROSCOPY     for ovarian pain  . ENDARTERECTOMY  04/12/2011   Procedure: ENDARTERECTOMY CAROTID;  Surgeon: Rosetta Posner, MD;  Location: [facility];  Service: Vascular;  Laterality: Left;    Allergies  Allergen Reactions  . Penicillins Itching and Rash     Current Outpatient Prescriptions  Medication Sig Dispense Refill  . alendronate (FOSAMAX) 70 MG tablet     . amLODipine-benazepril (LOTREL) 5-20 MG per capsule Take 2 capsules by mouth daily.     Marland Kitchen aspirin 81 MG tablet Take 81 mg by mouth daily.      Marland Kitchen atorvastatin (LIPITOR) 10 MG tablet Take 10 mg by mouth daily.    . Cholecalciferol (VITAMIN D3) 1000 UNITS CAPS Take 1,000 Units by mouth daily.      . Coenzyme Q10 (CO Q 10) 100 MG CAPS Take 200 mg by mouth daily.     . fish oil-omega-3 fatty acids 1000 MG capsule Take 2 g by mouth daily.      . hydrochlorothiazide 25 MG tablet Take 25 mg by mouth daily.      . Magnesium 250 MG TABS Take 1 tablet by mouth daily.     . multivitamin (THERAGRAN) per tablet Take 1 tablet by mouth daily.      Marland Kitchen oxybutynin (DITROPAN-XL) 10 MG 24 hr tablet Take 10 mg by mouth 2 (two) times daily.     . Red Yeast Rice 600 MG CAPS Take 2 capsules by mouth daily.      . vitamin C (ASCORBIC ACID) 500 MG tablet Take 500 mg by mouth daily.       Current Facility-Administered Medications  Medication Dose  Route Frequency Provider Last Rate Last Dose  . 0.9 %  sodium chloride infusion  500 mL Intravenous Continuous Gatha Mayer, MD        Review of Systems : See HPI for pertinent positives and negatives.  Physical Examination  Vitals:   12/09/15 1118 12/09/15 1121 12/09/15 1122  BP: (!) 158/83 (!) 162/84 (!) 150/81  Pulse: 70 75 75  Resp: 14    Temp: 97.4 F (36.3 C)    SpO2: 97%    Weight: 111 lb (50.3 kg)    Height: 4\' 11"  (1.499 m)     Body mass index is 22.42 kg/m.  General: WDWN female in NAD GAIT: normal Eyes: PERRLA Pulmonary: Respirations are non-labored, CTAB, good air movement in all fields.  Cardiac: regular rhythm, no detected murmur.  VASCULAR EXAM Carotid Bruits Left Right   Negative Negative    Radial pulses are 2+ palpable and equal.       LE Pulses LEFT RIGHT   POPLITEAL not palpable  not palpable   DORSALIS PEDIS  ANTERIOR TIBIAL palpable  palpable     Gastrointestinal: soft, nontender, BS WNL, no r/g,no palpable masses.  Musculoskeletal: Negative muscle atrophy/wasting. M/S 5/5 throughout, Extremities without ischemic changes.  Neurologic: A&O X 2 (to person and place), Appropriate Affect, Speech is normal CN 2-12 intact, Pain and light touch intact in extremities, Motor exam as listed above.    Non-Invasive Vascular Imaging CAROTID DUPLEX 12/09/2015   CEREBROVASCULAR DUPLEX EVALUATION    INDICATION: Carotid artery disease     PREVIOUS INTERVENTION(S): Left carotid endarterectomy 04/12/2011    DUPLEX EXAM:     RIGHT  LEFT  Peak Systolic Velocities (cm/s) End Diastolic Velocities (cm/s) Plaque LOCATION Peak Systolic Velocities (cm/s) End Diastolic Velocities (cm/s) Plaque  92 15  CCA PROXIMAL 75 6   58 9  CCA MID 62 5 HT  54 8 HT CCA DISTAL 57 11   80 9 HT ECA 56 4   102 21 HT ICA PROXIMAL 40 4   95 20  ICA MID 39 6   76 20  ICA DISTAL 53 12      ICA / CCA Ratio (PSV)   Antegrade  Vertebral Flow Antegrade    Brachial Systolic Pressure (mmHg)   Multiphasic (Subclavian artery) Brachial Artery Waveforms Multiphasic (Subclavian artery)    Plaque Morphology:  HM = Homogeneous, HT = Heterogeneous, CP = Calcific Plaque, SP = Smooth Plaque, IP = Irregular Plaque     ADDITIONAL FINDINGS:     IMPRESSION: Right internal carotid artery velocities suggest a <40% stenosis.  Patent left carotid endarterectomy site with no evidence of hyperplasia or restenosis.     Compared to the previous exam:  No significant change in comparison to the last exam on 12/03/2014.     Assessment: Rachel Mcguire is a 82 y.o.  fit appearing female who is status post left CEA in December  2012. She has no history of stroke or TIA. Today's carotid Duplex suggests right carotid endarterectomy site with no evidence of hyperplasia or restenosis.  Left internal carotid artery velocities suggest a <40% stenosis. No significant change in comparison to the last exam on 11/26/2013 and 12/03/14.  Son states her PCP decreased her antihypertensive medications, and that she has a new diagnosis of Alzheimer's Disease; he states she was not taking her medications properly.  I advised pt and son to return to her PCP's office to recheck and address her elevated blood pressure if it  remains elevated.   Plan: Follow-up in 18 months with Carotid Duplex scan.   I discussed in depth with the patient the nature of atherosclerosis, and emphasized the importance of maximal medical management including strict control of blood pressure, blood glucose, and lipid levels, obtaining regular exercise, and continued cessation of smoking.  The patient is aware that without maximal medical management the underlying atherosclerotic disease process will progress, limiting the benefit of any interventions. The patient was given information about stroke prevention and what symptoms should prompt the patient to seek immediate medical care. Thank you for allowing [place] to participate in this patient's care.  Clemon Chambers, RN, MSN, FNP-C [facility] Office: [phone]  Clinic Physician: Early  12/09/15 11:25 AM

## 2016-03-23 NOTE — Addendum Note (Signed)
Addended by: Lianne Cure A on: 03/23/2016 08:21 AM   Modules accepted: Orders

## 2016-07-14 ENCOUNTER — Emergency Department (HOSPITAL_COMMUNITY): Payer: Medicare Other

## 2016-07-14 ENCOUNTER — Inpatient Hospital Stay (HOSPITAL_COMMUNITY)
Admission: EM | Admit: 2016-07-14 | Discharge: 2016-07-18 | DRG: 482 | Disposition: A | Discharge status: Skilled Nursing Facility | Payer: Medicare Other | Attending: Internal Medicine | Admitting: Internal Medicine

## 2016-07-14 ENCOUNTER — Encounter (HOSPITAL_COMMUNITY): Payer: Self-pay | Admitting: Emergency Medicine

## 2016-07-14 DIAGNOSIS — I739 Peripheral vascular disease, unspecified: Secondary | ICD-10-CM | POA: Diagnosis present

## 2016-07-14 DIAGNOSIS — Y92009 Unspecified place in unspecified non-institutional (private) residence as the place of occurrence of the external cause: Secondary | ICD-10-CM

## 2016-07-14 DIAGNOSIS — Z79899 Other long term (current) drug therapy: Secondary | ICD-10-CM

## 2016-07-14 DIAGNOSIS — Z8249 Family history of ischemic heart disease and other diseases of the circulatory system: Secondary | ICD-10-CM | POA: Diagnosis not present

## 2016-07-14 DIAGNOSIS — E785 Hyperlipidemia, unspecified: Secondary | ICD-10-CM | POA: Diagnosis present

## 2016-07-14 DIAGNOSIS — Z880 Allergy status to penicillin: Secondary | ICD-10-CM | POA: Diagnosis not present

## 2016-07-14 DIAGNOSIS — W010XXA Fall on same level from slipping, tripping and stumbling without subsequent striking against object, initial encounter: Secondary | ICD-10-CM | POA: Diagnosis present

## 2016-07-14 DIAGNOSIS — S72002A Fracture of unspecified part of neck of left femur, initial encounter for closed fracture: Secondary | ICD-10-CM | POA: Diagnosis present

## 2016-07-14 DIAGNOSIS — M19042 Primary osteoarthritis, left hand: Secondary | ICD-10-CM | POA: Diagnosis present

## 2016-07-14 DIAGNOSIS — Z7982 Long term (current) use of aspirin: Secondary | ICD-10-CM

## 2016-07-14 DIAGNOSIS — M19041 Primary osteoarthritis, right hand: Secondary | ICD-10-CM | POA: Diagnosis present

## 2016-07-14 DIAGNOSIS — R001 Bradycardia, unspecified: Secondary | ICD-10-CM | POA: Diagnosis present

## 2016-07-14 DIAGNOSIS — I10 Essential (primary) hypertension: Secondary | ICD-10-CM | POA: Diagnosis present

## 2016-07-14 DIAGNOSIS — M810 Age-related osteoporosis without current pathological fracture: Secondary | ICD-10-CM | POA: Diagnosis present

## 2016-07-14 DIAGNOSIS — S72001A Fracture of unspecified part of neck of right femur, initial encounter for closed fracture: Secondary | ICD-10-CM | POA: Diagnosis present

## 2016-07-14 LAB — CBC WITH DIFFERENTIAL/PLATELET
Basophils Absolute: 0 10*3/uL (ref 0.0–0.1)
Basophils Relative: 0 %
Eosinophils Absolute: 0 10*3/uL (ref 0.0–0.7)
Eosinophils Relative: 1 %
HEMATOCRIT: 44.7 % (ref 36.0–46.0)
HEMOGLOBIN: 14.2 g/dL (ref 12.0–15.0)
LYMPHS ABS: 1.3 10*3/uL (ref 0.7–4.0)
Lymphocytes Relative: 21 %
MCH: 28.4 pg (ref 26.0–34.0)
MCHC: 31.8 g/dL (ref 30.0–36.0)
MCV: 89.4 fL (ref 78.0–100.0)
MONOS PCT: 6 %
Monocytes Absolute: 0.4 10*3/uL (ref 0.1–1.0)
NEUTROS ABS: 4.4 10*3/uL (ref 1.7–7.7)
Neutrophils Relative %: 72 %
Platelets: 257 10*3/uL (ref 150–400)
RBC: 5 MIL/uL (ref 3.87–5.11)
RDW: 13.7 % (ref 11.5–15.5)
WBC: 6 10*3/uL (ref 4.0–10.5)

## 2016-07-14 LAB — I-STAT CHEM 8, ED
BUN: 41 mg/dL — ABNORMAL HIGH (ref 6–20)
CALCIUM ION: 1.19 mmol/L (ref 1.15–1.40)
CHLORIDE: 103 mmol/L (ref 101–111)
Creatinine, Ser: 1 mg/dL (ref 0.44–1.00)
Glucose, Bld: 125 mg/dL — ABNORMAL HIGH (ref 65–99)
HCT: 45 % (ref 36.0–46.0)
HEMOGLOBIN: 15.3 g/dL — AB (ref 12.0–15.0)
POTASSIUM: 3.5 mmol/L (ref 3.5–5.1)
SODIUM: 142 mmol/L (ref 135–145)
TCO2: 33 mmol/L (ref 0–100)

## 2016-07-14 LAB — PROTIME-INR
INR: 0.91
Prothrombin Time: 12.3 seconds (ref 11.4–15.2)

## 2016-07-14 LAB — BASIC METABOLIC PANEL
ANION GAP: 6 (ref 5–15)
BUN: 39 mg/dL — ABNORMAL HIGH (ref 6–20)
CO2: 30 mmol/L (ref 22–32)
Calcium: 9.7 mg/dL (ref 8.9–10.3)
Chloride: 107 mmol/L (ref 101–111)
Creatinine, Ser: 0.95 mg/dL (ref 0.44–1.00)
GFR calc non Af Amer: 54 mL/min — ABNORMAL LOW (ref >60)
Glucose, Bld: 128 mg/dL — ABNORMAL HIGH (ref 65–99)
Potassium: 3.7 mmol/L (ref 3.5–5.1)
Sodium: 143 mmol/L (ref 135–145)

## 2016-07-14 LAB — TYPE AND SCREEN
ABO/RH(D): A POS
Antibody Screen: NEGATIVE

## 2016-07-14 LAB — ABO/RH: ABO/RH(D): A POS

## 2016-07-14 MED ORDER — HYDRALAZINE HCL 20 MG/ML IJ SOLN
10.0000 mg | INTRAMUSCULAR | Status: DC | PRN
  Administered 2016-07-15: 10 mg via INTRAVENOUS
  Filled 2016-07-14: qty 1

## 2016-07-14 MED ORDER — HYDROMORPHONE HCL 1 MG/ML IJ SOLN
0.5000 mg | Freq: Once | INTRAMUSCULAR | Status: AC
  Administered 2016-07-14: 0.5 mg via INTRAVENOUS
  Filled 2016-07-14: qty 0.5

## 2016-07-14 MED ORDER — MORPHINE SULFATE (PF) 4 MG/ML IV SOLN
0.5000 mg | INTRAVENOUS | Status: DC | PRN
  Administered 2016-07-14 – 2016-07-15 (×2): 0.52 mg via INTRAVENOUS
  Filled 2016-07-14 (×2): qty 1

## 2016-07-14 MED ORDER — SODIUM CHLORIDE 0.9 % IV SOLN
INTRAVENOUS | Status: DC
  Administered 2016-07-14: 23:00:00 via INTRAVENOUS

## 2016-07-14 NOTE — ED Triage Notes (Addendum)
Pt brought by EMS for slip and fall, struck bottom on ground. Left leg externally rotated and shortened. 200 mcg Fentanyl administered en route. Alert and oriented at baseline, has dementia. No head injury or LOC. Pt from independent living at [facility].

## 2016-07-14 NOTE — H&P (Signed)
History and Physical    Rachel Mcguire FVC:[id number] DOB: 1931-11-29 DOA: 07/14/2016  PCP: Mathews Argyle, MD  Patient coming from: Home.  Chief Complaint: Fall.  HPI: Rachel Mcguire is a 83 y.o. female with history of hypertension, hyperlipidemia had a fall at her home after patient slipped. Denies CT her head or losing consciousness. Patient was having persistent pain in the right hip area and presented to the ER. Denies any chest pain fever chills shortness of breath palpitations.   ED Course: X-rays reveal right hip fracture. Dr. Elmyra Ricks, on-call orthopedic surgeon was consulted. Patient is being admitted for possible surgery.  Review of Systems: As per HPI, rest all negative.   Past Medical History:  Diagnosis Date  . Arthritis    both hands  . Cancer ([place]) SKIN CANCERS REMOVED FROM FACE LEG KNEE      BCC/SCC  nose skin cancer  . Carotid artery occlusion   . Hyperglycemia   . Hyperlipidemia   . Hypertension   . Osteoporosis   . Overactive bladder   . Peripheral vascular disease ([place])   . Uterine fibroid     Past Surgical History:  Procedure Laterality Date  . CAROTID ENDARTERECTOMY  04/12/11   Left CEA  . COLONOSCOPY  2001   mild diverticulosis  . DIAGNOSTIC LAPAROSCOPY     for ovarian pain  . ENDARTERECTOMY  04/12/2011   Procedure: ENDARTERECTOMY CAROTID;  Surgeon: Rosetta Posner, MD;  Location: [facility];  Service: Vascular;  Laterality: Left;     reports that she has never smoked. She has never used smokeless tobacco. She reports that she does not drink alcohol or use drugs.  Allergies  Allergen Reactions  . Penicillins Itching and Rash    Has patient had a PCN reaction causing immediate rash, facial/tongue/throat swelling, SOB or lightheadedness with hypotension: unknown Has patient had a PCN reaction causing severe rash involving mucus membranes or skin necrosis: unknown Has patient had a PCN reaction that required hospitalization: unknown Has  patient had a PCN reaction occurring within the last 10 years: no If all of the above answers are "NO", then may proceed with Cephalosporin use.    Family History  Problem Relation Age of Onset  . Heart disease Father     Before age 26  . Stroke Father   . Heart attack Father   . Deep vein thrombosis Father   . Hypertension Father   . Breast cancer Paternal Grandmother   . Hypertension Mother   . Cancer Mother     Prior to Admission medications   Medication Sig Start Date End Date Taking? Authorizing Provider  aspirin 81 MG tablet Take 81 mg by mouth daily.     Yes Historical Provider, MD  atorvastatin (LIPITOR) 10 MG tablet Take 10 mg by mouth daily. 11/01/11  Yes Historical Provider, MD  donepezil (ARICEPT) 10 MG tablet Take 10 mg by mouth at bedtime.  11/11/15  Yes Historical Provider, MD  hydrochlorothiazide 25 MG tablet Take 25 mg by mouth daily.     Yes Historical Provider, MD    Physical Exam: Vitals:   07/14/16 2000 07/14/16 2030 07/14/16 2107 07/14/16 2150  BP: 150/69 156/72 (!) 171/76 (!) 101/56  Pulse: 63 64 (!) 51 95  Resp: 15 12 18 20   Temp:   98.4 F (36.9 C) 97.7 F (36.5 C)  TempSrc:   Oral Oral  SpO2: 99% 100% 100% 100%      Constitutional: Moderately built and nourished.  Vitals:   07/14/16 2000 07/14/16 2030 07/14/16 2107 07/14/16 2150  BP: 150/69 156/72 (!) 171/76 (!) 101/56  Pulse: 63 64 (!) 51 95  Resp: 15 12 18 20   Temp:   98.4 F (36.9 C) 97.7 F (36.5 C)  TempSrc:   Oral Oral  SpO2: 99% 100% 100% 100%   Eyes: Anicteric no pallor. ENMT: No discharge from the ears eyes nose and mouth. Neck: No mass felt. No neck rigidity. No JVD appreciated. Respiratory: No rhonchi or crepitations. Cardiovascular: S1 and S2 heard no murmurs appreciated. Abdomen: Soft nontender bowel sounds present. No guarding or rigidity. Musculoskeletal: Pain on moving right hip. Skin: No rash. Skin appears warm. Neurologic: Alert awake oriented to time place and  person. Moves all extremities. Psychiatric: Appears normal. Normal affect.   Labs on Admission: I have personally reviewed following labs and imaging studies  CBC:  Recent Labs Lab 07/14/16 1729 07/14/16 1741  WBC 6.0  --   NEUTROABS 4.4  --   HGB 14.2 15.3*  HCT 44.7 45.0  MCV 89.4  --   PLT 257  --    Basic Metabolic Panel:  Recent Labs Lab 07/14/16 1729 07/14/16 1741  NA 143 142  K 3.7 3.5  CL 107 103  CO2 30  --   GLUCOSE 128* 125*  BUN 39* 41*  CREATININE 0.95 1.00  CALCIUM 9.7  --    GFR: CrCl cannot be calculated (Unknown ideal weight.). Liver Function Tests: No results for input(s): AST, ALT, ALKPHOS, BILITOT, PROT, ALBUMIN in the last 168 hours. No results for input(s): LIPASE, AMYLASE in the last 168 hours. No results for input(s): AMMONIA in the last 168 hours. Coagulation Profile:  Recent Labs Lab 07/14/16 1729  INR 0.91   Cardiac Enzymes: No results for input(s): CKTOTAL, CKMB, CKMBINDEX, TROPONINI in the last 168 hours. BNP (last 3 results) No results for input(s): PROBNP in the last 8760 hours. HbA1C: No results for input(s): HGBA1C in the last 72 hours. CBG: No results for input(s): GLUCAP in the last 168 hours. Lipid Profile: No results for input(s): CHOL, HDL, LDLCALC, TRIG, CHOLHDL, LDLDIRECT in the last 72 hours. Thyroid Function Tests: No results for input(s): TSH, T4TOTAL, FREET4, T3FREE, THYROIDAB in the last 72 hours. Anemia Panel: No results for input(s): VITAMINB12, FOLATE, FERRITIN, TIBC, IRON, RETICCTPCT in the last 72 hours. Urine analysis:    Component Value Date/Time   COLORURINE YELLOW 04/05/2011 1026   APPEARANCEUR CLEAR 04/05/2011 1026   LABSPEC 1.008 04/05/2011 1026   PHURINE 6.0 04/05/2011 1026   GLUCOSEU NEGATIVE 04/05/2011 1026   HGBUR NEGATIVE 04/05/2011 1026   BILIRUBINUR NEGATIVE 04/05/2011 1026   KETONESUR NEGATIVE 04/05/2011 1026   PROTEINUR NEGATIVE 04/05/2011 1026   UROBILINOGEN 0.2 04/05/2011 1026     NITRITE NEGATIVE 04/05/2011 1026   LEUKOCYTESUR NEGATIVE 04/05/2011 1026   Sepsis Labs: @LABRCNTIP (procalcitonin:4,lacticidven:4) )No results found for this or any previous visit (from the past 240 hour(s)).   Radiological Exams on Admission: Dg Pelvis Portable  Result Date: 07/14/2016 CLINICAL DATA:  Pt brought by EMS for slip and fall, struck bottom on ground. Left leg externally rotated and shortened. EXAM: PORTABLE PELVIS 1-2 VIEWS COMPARISON:  None. FINDINGS: Acute fracture of the LEFT femoral neck with superior migration of the distal femur fragment. There is valgus angulation. Fracture appears at [facility] location. No pelvic fracture identified. IMPRESSION: Basicervical fracture of the LEFT femoral neck. Electronically Signed   By: Suzy Bouchard M.D.   On: 07/14/2016 17:30  Dg Chest Port 1 View  Result Date: 07/14/2016 CLINICAL DATA:  Recent fall, left leg externally rotated and shortened EXAM: PORTABLE CHEST 1 VIEW COMPARISON:  Chest x-ray of 04/05/2011 FINDINGS: No active infiltrate or effusion is seen. Mediastinal and hilar contours are unremarkable. Mild cardiomegaly is stable. Thoracic aortic atherosclerotic calcification is present. There are degenerative changes in the shoulders, right greater than left. IMPRESSION: No active lung disease.  Stable mild cardiomegaly. Electronically Signed   By: Ivar Drape M.D.   On: 07/14/2016 17:22    EKG: Independently reviewed. Sinus bradycardia with heart rate around 45 bpm. Nonspecific intraventricular conduction delay. On my exam at the bedside patient's heart rate is around 70 bpm.  Assessment/Plan Active Problems:   Closed right hip fracture [facility])   Essential hypertension   Closed right hip fracture, initial encounter (HCC)   Closed fracture of neck of left femur (HCC)    1. Closed right hip fracture status post mechanical fall - patient is at moderate risk for intermediate risk procedure. Patient will be kept nothing by  mouth past midnight. Pain relief medications. 2. Hypertension - will keep patient on when necessary IV hydralazine. 3. Hyperlipidemia and history of carotid endarterectomy - restart statins after surgery.  Patient's EKG was showing sinus bradycardia but my exam patient's heart rate is about 60-70 bpm. We'll check TSH and also recheck EKG.   DVT prophylaxis: SCDs. Code Status: Full code.  Family Communication: Patient's son.  Disposition Plan: Skilled nursing facility.  Consults called: Orthopedic surgery.  Admission status: Inpatient.    Rise Patience MD [facility] Pager [phone].  If 7PM-7AM, please contact night-coverage [email or web address] Password [facility]  07/14/2016, 10:29 PM

## 2016-07-14 NOTE — ED Notes (Signed)
Bed: WA01 Expected date:  Expected time:  Means of arrival:  Comments: EMS/fall leg deformity

## 2016-07-14 NOTE — ED Provider Notes (Signed)
[facility] DEPT Provider Note   CSN: [id number] Arrival date & time: 07/14/16  1649     History   Chief Complaint Chief Complaint  Patient presents with  . Fall  . Hip Injury    HPI Rachel Mcguire is a 83 y.o. female.  83 yo F with a chief complaint of a mechanical fall. Patient slipped walking on hardwood and landed on her left buttock. Patient was unable to walk after the incident. Has a significantly shortened left leg. EMS gave her 200 mics of fentanyl.   The history is provided by the patient.  Illness  This is a new problem. The current episode started 3 to 5 hours ago. The problem occurs constantly. The problem has not changed since onset.Pertinent negatives include no chest pain, no headaches and no shortness of breath. Nothing aggravates the symptoms. Nothing relieves the symptoms. She has tried nothing for the symptoms. The treatment provided no relief.    Past Medical History:  Diagnosis Date  . Arthritis    both hands  . Cancer ([place]) SKIN CANCERS REMOVED FROM FACE LEG KNEE      BCC/SCC  nose skin cancer  . Carotid artery occlusion   . Hyperglycemia   . Hyperlipidemia   . Hypertension   . Osteoporosis   . Overactive bladder   . Peripheral vascular disease ([place])   . Uterine fibroid     Patient Active Problem List   Diagnosis Date Noted  . Aftercare following surgery of the circulatory system, [place] 11/20/2013  . Occlusion and stenosis of carotid artery without mention of cerebral infarction 03/11/2011    Past Surgical History:  Procedure Laterality Date  . CAROTID ENDARTERECTOMY  04/12/11   Left CEA  . COLONOSCOPY  2001   mild diverticulosis  . DIAGNOSTIC LAPAROSCOPY     for ovarian pain  . ENDARTERECTOMY  04/12/2011   Procedure: ENDARTERECTOMY CAROTID;  Surgeon: Rosetta Posner, MD;  Location: [facility];  Service: Vascular;  Laterality: Left;    OB History    No data available       Home Medications    Prior to Admission medications     Medication Sig Start Date End Date Taking? Authorizing Provider  aspirin 81 MG tablet Take 81 mg by mouth daily.     Yes Historical Provider, MD  atorvastatin (LIPITOR) 10 MG tablet Take 10 mg by mouth daily. 11/01/11  Yes Historical Provider, MD  donepezil (ARICEPT) 10 MG tablet Take 10 mg by mouth at bedtime.  11/11/15  Yes Historical Provider, MD  hydrochlorothiazide 25 MG tablet Take 25 mg by mouth daily.     Yes Historical Provider, MD    Family History Family History  Problem Relation Age of Onset  . Heart disease Father     Before age 66  . Stroke Father   . Heart attack Father   . Deep vein thrombosis Father   . Hypertension Father   . Breast cancer Paternal Grandmother   . Hypertension Mother   . Cancer Mother     Social History Social History  Substance Use Topics  . Smoking status: Never Smoker  . Smokeless tobacco: Never Used  . Alcohol use No     Allergies   Penicillins   Review of Systems Review of Systems  Constitutional: Negative for chills and fever.  HENT: Negative for congestion and rhinorrhea.   Eyes: Negative for redness and visual disturbance.  Respiratory: Negative for shortness of breath and wheezing.  Cardiovascular: Negative for chest pain and palpitations.  Gastrointestinal: Negative for nausea and vomiting.  Genitourinary: Negative for dysuria and urgency.  Musculoskeletal: Positive for arthralgias and gait problem. Negative for myalgias.  Skin: Negative for pallor and wound.  Neurological: Negative for dizziness and headaches.     Physical Exam Updated Vital Signs BP 142/68   Pulse 71   Temp 97.4 F (36.3 C) (Oral)   Resp 17   SpO2 99%   Physical Exam  Constitutional: She is oriented to person, place, and time. She appears well-developed and well-nourished. No distress.  HENT:  Head: Normocephalic and atraumatic.  Eyes: EOM are normal. Pupils are equal, round, and reactive to light.  Neck: Normal range of motion. Neck supple.   Cardiovascular: Normal rate and regular rhythm.  Exam reveals no gallop and no friction rub.   No murmur heard. Pulmonary/Chest: Effort normal. She has no wheezes. She has no rales.  Abdominal: Soft. She exhibits no distension. There is no tenderness.  Musculoskeletal: She exhibits deformity. She exhibits no edema or tenderness.  Left leg is significantly shortened. Pain with internal and external rotation of the leg. Significantly decreased left dorsalis pedis compared to the right.  Neurological: She is alert and oriented to person, place, and time.  Skin: Skin is warm and dry. She is not diaphoretic.  Psychiatric: She has a normal mood and affect. Her behavior is normal.  Nursing note and vitals reviewed.    ED Treatments / Results  Labs (all labs ordered are listed, but only abnormal results are displayed) Labs Reviewed  BASIC METABOLIC PANEL - Abnormal; Notable for the following:       Result Value   Glucose, Bld 128 (*)    BUN 39 (*)    GFR calc non Af Amer 54 (*)    All other components within normal limits  I-STAT CHEM 8, ED - Abnormal; Notable for the following:    BUN 41 (*)    Glucose, Bld 125 (*)    Hemoglobin 15.3 (*)    All other components within normal limits  CBC WITH DIFFERENTIAL/PLATELET  PROTIME-INR  TYPE AND SCREEN    EKG  EKG Interpretation None       Radiology Dg Pelvis Portable  Result Date: 07/14/2016 CLINICAL DATA:  Pt brought by EMS for slip and fall, struck bottom on ground. Left leg externally rotated and shortened. EXAM: PORTABLE PELVIS 1-2 VIEWS COMPARISON:  None. FINDINGS: Acute fracture of the LEFT femoral neck with superior migration of the distal femur fragment. There is valgus angulation. Fracture appears at [facility] location. No pelvic fracture identified. IMPRESSION: Basicervical fracture of the LEFT femoral neck. Electronically Signed   By: Suzy Bouchard M.D.   On: 07/14/2016 17:30   Dg Chest Port 1 View  Result Date:  07/14/2016 CLINICAL DATA:  Recent fall, left leg externally rotated and shortened EXAM: PORTABLE CHEST 1 VIEW COMPARISON:  Chest x-ray of 04/05/2011 FINDINGS: No active infiltrate or effusion is seen. Mediastinal and hilar contours are unremarkable. Mild cardiomegaly is stable. Thoracic aortic atherosclerotic calcification is present. There are degenerative changes in the shoulders, right greater than left. IMPRESSION: No active lung disease.  Stable mild cardiomegaly. Electronically Signed   By: Ivar Drape M.D.   On: 07/14/2016 17:22    Procedures Procedures (including critical care time)  Medications Ordered in ED Medications  HYDROmorphone (DILAUDID) injection 0.5 mg (0.5 mg Intravenous Given 07/14/16 1907)     Initial Impression / Assessment and Plan / ED  Course  I have reviewed the triage vital signs and the nursing notes.  Pertinent labs & imaging results that were available during my care of the patient were reviewed by me and considered in my medical decision making (see chart for details).     83 yo F With a chief complaint of a mechanical fall. Patient has a left femoral neck fracture. This was discussed with Dr. Maureen Ralphs.  He will likely do an operation in the morning. I discussed with him that the pulse on the left was significantly more diminished than the right. He thinks that this is likely a chronic issue with the patient having a significant history of vascular disease. Would like her to stay at [facility]. Will admit.  The patients results and plan were reviewed and discussed.   Any x-rays performed were independently reviewed by myself.   Differential diagnosis were considered with the presenting HPI.  Medications  HYDROmorphone (DILAUDID) injection 0.5 mg (0.5 mg Intravenous Given 07/14/16 1907)    Vitals:   07/14/16 1730 07/14/16 1800 07/14/16 1830 07/14/16 1900  BP: 152/68 130/69 140/68 142/68  Pulse: (!) 45 (!) 55 68 71  Resp: 14 19 15 17   Temp:      TempSrc:       SpO2: 100% 100% 98% 99%    Final diagnoses:  Closed fracture of neck of left femur, initial encounter ([place])    Admission/ observation were discussed with the admitting physician, patient and/or family and they are comfortable with the plan.    Final Clinical Impressions(s) / ED Diagnoses   Final diagnoses:  Closed fracture of neck of left femur, initial encounter [facility])    New Prescriptions New Prescriptions   No medications on file     Deno Etienne, DO 07/14/16 1910

## 2016-07-14 NOTE — Consult Note (Addendum)
Reason for Consult:Left femoral neck fracture Referring Physician: Dr. Benjamine Sprague TAWSHA Rachel Mcguire is an 83 y.o. female.  HPI: 83 yo female resident at [facility] independent living who was wearing slippers and slipped and fell landing on her left buttocks with immediate left hip pain and inability to get up. She did not have any dizziness or lightheadedness associated with the fall. This was a mechanical fall. She did not hit her head or have a loss of consciousness. Her only complaint is left hip pain without any associated numbness or tingling in her foot. She was able to ambulate independently prior to the fall but had balance issues according to her family and her physician, Dr. Felipa Eth, recommended using a walker, which she has not been using. I was consulted today for evaluation and management of this fracture.   Past Medical History:  Diagnosis Date  . Arthritis    both hands  . Cancer ([place]) SKIN CANCERS REMOVED FROM FACE LEG KNEE      BCC/SCC  nose skin cancer  . Carotid artery occlusion   . Hyperglycemia   . Hyperlipidemia   . Hypertension   . Osteoporosis   . Overactive bladder   . Peripheral vascular disease ([place])   . Uterine fibroid     Past Surgical History:  Procedure Laterality Date  . CAROTID ENDARTERECTOMY  04/12/11   Left CEA  . COLONOSCOPY  2001   mild diverticulosis  . DIAGNOSTIC LAPAROSCOPY     for ovarian pain  . ENDARTERECTOMY  04/12/2011   Procedure: ENDARTERECTOMY CAROTID;  Surgeon: Rosetta Posner, MD;  Location: [facility] OR;  Service: Vascular;  Laterality: Left;    Family History  Problem Relation Age of Onset  . Heart disease Father     Before age 102  . Stroke Father   . Heart attack Father   . Deep vein thrombosis Father   . Hypertension Father   . Breast cancer Paternal Grandmother   . Hypertension Mother   . Cancer Mother     Social History:  reports that she has never smoked. She has never used smokeless tobacco. She reports that she does not  drink alcohol or use drugs.  Allergies:  Allergies  Allergen Reactions  . Penicillins Itching and Rash    Has patient had a PCN reaction causing immediate rash, facial/tongue/throat swelling, SOB or lightheadedness with hypotension: unknown Has patient had a PCN reaction causing severe rash involving mucus membranes or skin necrosis: unknown Has patient had a PCN reaction that required hospitalization: unknown Has patient had a PCN reaction occurring within the last 10 years: no If all of the above answers are "NO", then may proceed with Cephalosporin use.    Medications: I have reviewed the patient's current medications.  Results for orders placed or performed during the hospital encounter of 07/14/16 (from the past 48 hour(s))  Basic metabolic panel     Status: Abnormal   Collection Time: 07/14/16  5:29 PM  Result Value Ref Range   Sodium 143 135 - 145 mmol/L   Potassium 3.7 3.5 - 5.1 mmol/L   Chloride 107 101 - 111 mmol/L   CO2 30 22 - 32 mmol/L   Glucose, Bld 128 (H) 65 - 99 mg/dL   BUN 39 (H) 6 - 20 mg/dL   Creatinine, Ser 0.95 0.44 - 1.00 mg/dL   Calcium 9.7 8.9 - 10.3 mg/dL   GFR calc non Af Amer 54 (L) >60 mL/min   GFR calc Af Amer >60 >  60 mL/min    Comment: (NOTE) The eGFR has been calculated using the CKD EPI equation. This calculation has not been validated in all clinical situations. eGFR's persistently <60 mL/min signify possible Chronic Kidney Disease.    Anion gap 6 5 - 15  CBC WITH DIFFERENTIAL     Status: None   Collection Time: 07/14/16  5:29 PM  Result Value Ref Range   WBC 6.0 4.0 - 10.5 K/uL   RBC 5.00 3.87 - 5.11 MIL/uL   Hemoglobin 14.2 12.0 - 15.0 g/dL   HCT 44.7 36.0 - 46.0 %   MCV 89.4 78.0 - 100.0 fL   MCH 28.4 26.0 - 34.0 pg   MCHC 31.8 30.0 - 36.0 g/dL   RDW 13.7 11.5 - 15.5 %   Platelets 257 150 - 400 K/uL   Neutrophils Relative % 72 %   Neutro Abs 4.4 1.7 - 7.7 K/uL   Lymphocytes Relative 21 %   Lymphs Abs 1.3 0.7 - 4.0 K/uL    Monocytes Relative 6 %   Monocytes Absolute 0.4 0.1 - 1.0 K/uL   Eosinophils Relative 1 %   Eosinophils Absolute 0.0 0.0 - 0.7 K/uL   Basophils Relative 0 %   Basophils Absolute 0.0 0.0 - 0.1 K/uL  Protime-INR     Status: None   Collection Time: 07/14/16  5:29 PM  Result Value Ref Range   Prothrombin Time 12.3 11.4 - 15.2 seconds   INR 0.91   I-Stat Chem 8, ED     Status: Abnormal   Collection Time: 07/14/16  5:41 PM  Result Value Ref Range   Sodium 142 135 - 145 mmol/L   Potassium 3.5 3.5 - 5.1 mmol/L   Chloride 103 101 - 111 mmol/L   BUN 41 (H) 6 - 20 mg/dL   Creatinine, Ser 1.00 0.44 - 1.00 mg/dL   Glucose, Bld 125 (H) 65 - 99 mg/dL   Calcium, Ion 1.19 1.15 - 1.40 mmol/L   TCO2 33 0 - 100 mmol/L   Hemoglobin 15.3 (H) 12.0 - 15.0 g/dL   HCT 45.0 36.0 - 46.0 %    Dg Pelvis Portable  Result Date: 07/14/2016 CLINICAL DATA:  Pt brought by EMS for slip and fall, struck bottom on ground. Left leg externally rotated and shortened. EXAM: PORTABLE PELVIS 1-2 VIEWS COMPARISON:  None. FINDINGS: Acute fracture of the LEFT femoral neck with superior migration of the distal femur fragment. There is valgus angulation. Fracture appears at [facility] location. No pelvic fracture identified. IMPRESSION: Basicervical fracture of the LEFT femoral neck. Electronically Signed   By: Suzy Bouchard M.D.   On: 07/14/2016 17:30   Dg Chest Port 1 View  Result Date: 07/14/2016 CLINICAL DATA:  Recent fall, left leg externally rotated and shortened EXAM: PORTABLE CHEST 1 VIEW COMPARISON:  Chest x-ray of 04/05/2011 FINDINGS: No active infiltrate or effusion is seen. Mediastinal and hilar contours are unremarkable. Mild cardiomegaly is stable. Thoracic aortic atherosclerotic calcification is present. There are degenerative changes in the shoulders, right greater than left. IMPRESSION: No active lung disease.  Stable mild cardiomegaly. Electronically Signed   By: Ivar Drape M.D.   On: 07/14/2016 17:22     ROS Blood pressure 142/68, pulse 71, temperature 97.4 F (36.3 C), temperature source Oral, resp. rate 17, SpO2 99 %. Physical Exam Physical Examination: General appearance - alert, well appearing, and in no distress Mental status - alert, oriented to person, place, and time Chest - clear to auscultation, no wheezes, rales or  rhonchi, symmetric air entry Heart - normal rate, regular rhythm, normal S1, S2, no murmurs, rubs, clicks or gallops Abdomen - soft, nontender, nondistended, no masses or organomegaly Neurological - alert, oriented, normal speech, no focal findings or movement disorder noted LLE- shortened and externally rotated, EHL, FHL, TA, gastroc intact; + PT pulse; sensation intact in foot; tender over lateral hip  X-ray - Displaced left femoral neck fracture   Assessment/Plan: Left femoral neck fracture- Will require surgical treatment in the form of hemiarthroplasty vs. Total Hip Arthroplasty. Discussed in detail with the patient and her family and they elect to proceed. Will plan on surgical treatment tomorrow evening by myself or one of my partners.  Gearlean Alf 07/14/2016, 7:21 PM

## 2016-07-15 ENCOUNTER — Encounter (HOSPITAL_COMMUNITY)
Admission: EM | Disposition: A | Discharge status: Skilled Nursing Facility | Payer: Self-pay | Source: Home / Self Care | Attending: Internal Medicine

## 2016-07-15 ENCOUNTER — Inpatient Hospital Stay (HOSPITAL_COMMUNITY): Payer: Medicare Other | Admitting: Anesthesiology

## 2016-07-15 ENCOUNTER — Inpatient Hospital Stay (HOSPITAL_COMMUNITY): Payer: Medicare Other

## 2016-07-15 ENCOUNTER — Encounter (HOSPITAL_COMMUNITY): Payer: Self-pay | Admitting: Surgery

## 2016-07-15 HISTORY — PX: HIP ARTHROPLASTY: SHX981

## 2016-07-15 LAB — BASIC METABOLIC PANEL
ANION GAP: 9 (ref 5–15)
BUN: 35 mg/dL — ABNORMAL HIGH (ref 6–20)
CALCIUM: 9.5 mg/dL (ref 8.9–10.3)
CO2: 27 mmol/L (ref 22–32)
Chloride: 106 mmol/L (ref 101–111)
Creatinine, Ser: 0.73 mg/dL (ref 0.44–1.00)
Glucose, Bld: 142 mg/dL — ABNORMAL HIGH (ref 65–99)
POTASSIUM: 3.5 mmol/L (ref 3.5–5.1)
Sodium: 142 mmol/L (ref 135–145)

## 2016-07-15 LAB — CREATININE, SERUM
Creatinine, Ser: 0.79 mg/dL (ref 0.44–1.00)
GFR calc Af Amer: >60 mL/min (ref >60)

## 2016-07-15 LAB — SURGICAL PCR SCREEN
MRSA, PCR: NEGATIVE
STAPHYLOCOCCUS AUREUS: POSITIVE — AB

## 2016-07-15 LAB — CBC
HCT: 40.9 % (ref 36.0–46.0)
HEMATOCRIT: 41.8 % (ref 36.0–46.0)
HEMOGLOBIN: 13.4 g/dL (ref 12.0–15.0)
Hemoglobin: 13.8 g/dL (ref 12.0–15.0)
MCH: 29.2 pg (ref 26.0–34.0)
MCH: 29.3 pg (ref 26.0–34.0)
MCHC: 33.7 g/dL (ref 30.0–36.0)
MCHC: 32.1 g/dL (ref 30.0–36.0)
MCV: 86.5 fL (ref 78.0–100.0)
MCV: 91.5 fL (ref 78.0–100.0)
PLATELETS: 258 10*3/uL (ref 150–400)
Platelets: 197 10*3/uL (ref 150–400)
RBC: 4.73 MIL/uL (ref 3.87–5.11)
RBC: 4.57 MIL/uL (ref 3.87–5.11)
RDW: 13.7 % (ref 11.5–15.5)
RDW: 14.3 % (ref 11.5–15.5)
WBC: 9.7 10*3/uL (ref 4.0–10.5)
WBC: 7.9 10*3/uL (ref 4.0–10.5)

## 2016-07-15 SURGERY — HEMIARTHROPLASTY, HIP, DIRECT ANTERIOR APPROACH, FOR FRACTURE
Anesthesia: General | Site: Hip | Laterality: Left

## 2016-07-15 MED ORDER — ENOXAPARIN SODIUM 30 MG/0.3ML ~~LOC~~ SOLN
30.0000 mg | SUBCUTANEOUS | Status: DC
  Administered 2016-07-16 – 2016-07-18 (×3): 30 mg via SUBCUTANEOUS
  Filled 2016-07-15 (×3): qty 0.3

## 2016-07-15 MED ORDER — CHLORHEXIDINE GLUCONATE CLOTH 2 % EX PADS
6.0000 | MEDICATED_PAD | Freq: Every day | CUTANEOUS | Status: DC
  Administered 2016-07-17 – 2016-07-18 (×2): 6 via TOPICAL

## 2016-07-15 MED ORDER — MUPIROCIN 2 % EX OINT
1.0000 "application " | TOPICAL_OINTMENT | Freq: Two times a day (BID) | CUTANEOUS | Status: DC
  Administered 2016-07-15 – 2016-07-18 (×4): 1 via NASAL
  Filled 2016-07-15: qty 22

## 2016-07-15 MED ORDER — CHLORHEXIDINE GLUCONATE 4 % EX LIQD: 60.0000 mL | Freq: Once | CUTANEOUS | Status: DC

## 2016-07-15 MED ORDER — ACETAMINOPHEN 650 MG RE SUPP: 650.0000 mg | Freq: Four times a day (QID) | RECTAL | Status: DC | PRN

## 2016-07-15 MED ORDER — ONDANSETRON HCL 4 MG/2ML IJ SOLN: 4.0000 mg | Freq: Four times a day (QID) | INTRAMUSCULAR | Status: DC

## 2016-07-15 MED ORDER — LIDOCAINE HCL (CARDIAC) 20 MG/ML IV SOLN
INTRAVENOUS | Status: DC | PRN
  Administered 2016-07-15: 60 mg via INTRAVENOUS

## 2016-07-15 MED ORDER — METHOCARBAMOL 1000 MG/10ML IJ SOLN
500.0000 mg | Freq: Four times a day (QID) | INTRAMUSCULAR | Status: DC | PRN
  Filled 2016-07-15: qty 5

## 2016-07-15 MED ORDER — SODIUM CHLORIDE 0.9 % IV SOLN
INTRAVENOUS | Status: DC
  Administered 2016-07-15: 09:00:00 via INTRAVENOUS

## 2016-07-15 MED ORDER — ONDANSETRON HCL 4 MG/2ML IJ SOLN
INTRAMUSCULAR | Status: AC
  Filled 2016-07-15: qty 2

## 2016-07-15 MED ORDER — PROPOFOL 10 MG/ML IV BOLUS
INTRAVENOUS | Status: DC | PRN
  Administered 2016-07-15: 100 mg via INTRAVENOUS

## 2016-07-15 MED ORDER — METHOCARBAMOL 500 MG PO TABS
ORAL_TABLET | ORAL | Status: AC
  Filled 2016-07-15: qty 1

## 2016-07-15 MED ORDER — METOCLOPRAMIDE HCL 5 MG PO TABS: 5.0000 mg | ORAL_TABLET | Freq: Three times a day (TID) | ORAL | Status: DC | PRN

## 2016-07-15 MED ORDER — EPHEDRINE SULFATE 50 MG/ML IJ SOLN
INTRAMUSCULAR | Status: DC | PRN
  Administered 2016-07-15 (×3): 10 mg via INTRAVENOUS

## 2016-07-15 MED ORDER — OXYCODONE HCL 5 MG/5ML PO SOLN
ORAL | Status: AC
  Filled 2016-07-15: qty 5

## 2016-07-15 MED ORDER — ONDANSETRON HCL 4 MG/2ML IJ SOLN
4.0000 mg | Freq: Once | INTRAMUSCULAR | Status: DC | PRN
Start: 2016-07-15 — End: 2016-07-15

## 2016-07-15 MED ORDER — SODIUM CHLORIDE 0.9 % IR SOLN
Status: DC | PRN
  Administered 2016-07-15: 1000 mL

## 2016-07-15 MED ORDER — HYDROMORPHONE HCL 1 MG/ML IJ SOLN
0.2500 mg | INTRAMUSCULAR | Status: DC | PRN
  Administered 2016-07-15 (×3): 0.25 mg via INTRAVENOUS
  Filled 2016-07-15 (×3): qty 0.5

## 2016-07-15 MED ORDER — ROCURONIUM BROMIDE 100 MG/10ML IV SOLN
INTRAVENOUS | Status: DC | PRN
  Administered 2016-07-15 (×2): 20 mg via INTRAVENOUS

## 2016-07-15 MED ORDER — ONDANSETRON HCL 4 MG PO TABS: 4.0000 mg | ORAL_TABLET | Freq: Four times a day (QID) | ORAL | Status: DC | PRN

## 2016-07-15 MED ORDER — CLINDAMYCIN PHOSPHATE 900 MG/50ML IV SOLN
900.0000 mg | Freq: Once | INTRAVENOUS | Status: AC
  Administered 2016-07-15: 900 mg via INTRAVENOUS
  Filled 2016-07-15: qty 50

## 2016-07-15 MED ORDER — ONDANSETRON HCL 4 MG/2ML IJ SOLN
4.0000 mg | Freq: Four times a day (QID) | INTRAMUSCULAR | Status: DC | PRN
  Administered 2016-07-15: 09:00:00 4 mg via INTRAVENOUS
  Filled 2016-07-15: qty 2

## 2016-07-15 MED ORDER — OXYCODONE HCL 5 MG PO TABS
5.0000 mg | ORAL_TABLET | ORAL | Status: DC | PRN
  Administered 2016-07-15 (×2): 5 mg via ORAL
  Administered 2016-07-16: 10 mg via ORAL
  Administered 2016-07-16: 5 mg via ORAL
  Administered 2016-07-16: 10 mg via ORAL
  Administered 2016-07-16: 5 mg via ORAL
  Administered 2016-07-16 – 2016-07-18 (×5): 10 mg via ORAL
  Filled 2016-07-15 (×2): qty 2
  Filled 2016-07-15: qty 1
  Filled 2016-07-15 (×2): qty 2
  Filled 2016-07-15: qty 1
  Filled 2016-07-15: qty 2
  Filled 2016-07-15: qty 1
  Filled 2016-07-15 (×2): qty 2
  Filled 2016-07-15: qty 1

## 2016-07-15 MED ORDER — HYDROMORPHONE HCL 1 MG/ML IJ SOLN
0.5000 mg | INTRAMUSCULAR | Status: DC | PRN
  Administered 2016-07-15: 10:00:00 0.5 mg via INTRAVENOUS
  Filled 2016-07-15: qty 0.5

## 2016-07-15 MED ORDER — FENTANYL CITRATE (PF) 100 MCG/2ML IJ SOLN
25.0000 ug | INTRAMUSCULAR | Status: DC | PRN
  Administered 2016-07-15 (×2): 25 ug via INTRAVENOUS

## 2016-07-15 MED ORDER — METHOCARBAMOL 500 MG PO TABS
500.0000 mg | ORAL_TABLET | Freq: Four times a day (QID) | ORAL | Status: DC | PRN
  Administered 2016-07-15 – 2016-07-16 (×2): 500 mg via ORAL
  Filled 2016-07-15: qty 1

## 2016-07-15 MED ORDER — POVIDONE-IODINE 10 % EX SWAB: 2.0000 "application " | Freq: Once | CUTANEOUS | Status: DC

## 2016-07-15 MED ORDER — METOCLOPRAMIDE HCL 5 MG/ML IJ SOLN: 5.0000 mg | Freq: Three times a day (TID) | INTRAMUSCULAR | Status: DC | PRN

## 2016-07-15 MED ORDER — DIAZEPAM 5 MG/ML IJ SOLN: 2.5000 mg | Freq: Once | INTRAMUSCULAR | Status: DC

## 2016-07-15 MED ORDER — MORPHINE SULFATE (PF) 2 MG/ML IV SOLN: 1.0000 mg | INTRAVENOUS | Status: DC | PRN

## 2016-07-15 MED ORDER — SUGAMMADEX SODIUM 200 MG/2ML IV SOLN
INTRAVENOUS | Status: AC
  Filled 2016-07-15: qty 2

## 2016-07-15 MED ORDER — ENSURE ENLIVE PO LIQD
237.0000 mL | Freq: Two times a day (BID) | ORAL | Status: DC
  Administered 2016-07-16 – 2016-07-18 (×3): 237 mL via ORAL

## 2016-07-15 MED ORDER — ACETAMINOPHEN 10 MG/ML IV SOLN: 1000.0000 mg | Freq: Once | INTRAVENOUS | Status: DC

## 2016-07-15 MED ORDER — ONDANSETRON HCL 4 MG/2ML IJ SOLN: 4.0000 mg | Freq: Four times a day (QID) | INTRAMUSCULAR | Status: DC | PRN

## 2016-07-15 MED ORDER — ACETAMINOPHEN 325 MG PO TABS: 650.0000 mg | ORAL_TABLET | Freq: Four times a day (QID) | ORAL | Status: DC | PRN

## 2016-07-15 MED ORDER — METHOCARBAMOL 1000 MG/10ML IJ SOLN
500.0000 mg | Freq: Four times a day (QID) | INTRAVENOUS | Status: DC | PRN
  Administered 2016-07-15 (×2): 500 mg via INTRAVENOUS
  Filled 2016-07-15 (×2): qty 5
  Filled 2016-07-15: qty 550
  Filled 2016-07-15: qty 5

## 2016-07-15 MED ORDER — EPHEDRINE 5 MG/ML INJ
INTRAVENOUS | Status: AC
  Filled 2016-07-15: qty 10

## 2016-07-15 MED ORDER — OXYCODONE HCL 5 MG PO TABS
ORAL_TABLET | ORAL | Status: AC
  Filled 2016-07-15: qty 1

## 2016-07-15 MED ORDER — CEFAZOLIN SODIUM-DEXTROSE 2-4 GM/100ML-% IV SOLN
2.0000 g | INTRAVENOUS | Status: DC
  Filled 2016-07-15 (×2): qty 100

## 2016-07-15 MED ORDER — HYDROMORPHONE HCL 2 MG/ML IJ SOLN
0.5000 mg | INTRAMUSCULAR | Status: DC | PRN
  Administered 2016-07-15 – 2016-07-17 (×2): 0.5 mg via INTRAVENOUS
  Filled 2016-07-15 (×2): qty 1

## 2016-07-15 MED ORDER — CEFAZOLIN IN D5W 1 GM/50ML IV SOLN
1.0000 g | Freq: Once | INTRAVENOUS | Status: DC
  Filled 2016-07-15: qty 50

## 2016-07-15 MED ORDER — FENTANYL CITRATE (PF) 100 MCG/2ML IJ SOLN
INTRAMUSCULAR | Status: DC | PRN
  Administered 2016-07-15 (×2): 50 ug via INTRAVENOUS

## 2016-07-15 MED ORDER — PHENYLEPHRINE HCL 10 MG/ML IJ SOLN
INTRAMUSCULAR | Status: DC | PRN
  Administered 2016-07-15: 25 ug/min via INTRAVENOUS

## 2016-07-15 MED ORDER — CLINDAMYCIN PHOSPHATE 600 MG/50ML IV SOLN
600.0000 mg | Freq: Four times a day (QID) | INTRAVENOUS | Status: AC
  Administered 2016-07-15 – 2016-07-16 (×3): 600 mg via INTRAVENOUS
  Filled 2016-07-15 (×3): qty 50

## 2016-07-15 MED ORDER — LACTATED RINGERS IV SOLN
INTRAVENOUS | Status: DC
  Administered 2016-07-15 (×3): via INTRAVENOUS

## 2016-07-15 MED ORDER — MEPERIDINE HCL 25 MG/ML IJ SOLN: 6.2500 mg | INTRAMUSCULAR | Status: DC | PRN

## 2016-07-15 MED ORDER — FENTANYL CITRATE (PF) 100 MCG/2ML IJ SOLN
INTRAMUSCULAR | Status: AC
  Filled 2016-07-15: qty 2

## 2016-07-15 MED ORDER — ROCURONIUM BROMIDE 50 MG/5ML IV SOSY
PREFILLED_SYRINGE | INTRAVENOUS | Status: AC
  Filled 2016-07-15: qty 5

## 2016-07-15 MED ORDER — HYDROMORPHONE HCL 1 MG/ML IJ SOLN
0.5000 mg | Freq: Once | INTRAMUSCULAR | Status: AC
  Administered 2016-07-15: 04:00:00 0.5 mg via INTRAVENOUS
  Filled 2016-07-15: qty 0.5

## 2016-07-15 MED ORDER — SUGAMMADEX SODIUM 200 MG/2ML IV SOLN
INTRAVENOUS | Status: DC | PRN
  Administered 2016-07-15: 100 mg via INTRAVENOUS

## 2016-07-15 MED ORDER — PANTOPRAZOLE SODIUM 40 MG IV SOLR
40.0000 mg | Freq: Two times a day (BID) | INTRAVENOUS | Status: DC
  Administered 2016-07-15 – 2016-07-17 (×5): 40 mg via INTRAVENOUS
  Filled 2016-07-15 (×5): qty 40

## 2016-07-15 SURGICAL SUPPLY — 43 items
BIPOLAR PROS AML 44 (Hips) ×2 IMPLANT
BIPOLAR PROS AML 44MM (Hips) ×1 IMPLANT
BLADE SAGITTAL (BLADE) ×3
BLADE SAW THK.89X75X18XSGTL (BLADE) ×1 IMPLANT
CAPT HIP HEMI 2 ×2 IMPLANT
COVER SURGICAL LIGHT HANDLE (MISCELLANEOUS) ×3 IMPLANT
DRAPE HIP W/POCKET STRL (DRAPE) ×3 IMPLANT
DRAPE INCISE IOBAN 85X60 (DRAPES) ×5 IMPLANT
DRAPE U-SHAPE 47X51 STRL (DRAPES) ×6 IMPLANT
DRSG AQUACEL AG ADV 3.5X10 (GAUZE/BANDAGES/DRESSINGS) ×3 IMPLANT
DURAPREP 26ML APPLICATOR (WOUND CARE) ×4 IMPLANT
ELECT BLADE 4.0 EZ CLEAN MEGAD (MISCELLANEOUS) ×3
ELECT CAUTERY BLADE 6.4 (BLADE) ×3 IMPLANT
ELECT REM PT RETURN 9FT ADLT (ELECTROSURGICAL) ×3
ELECTRODE BLDE 4.0 EZ CLN MEGD (MISCELLANEOUS) IMPLANT
ELECTRODE REM PT RTRN 9FT ADLT (ELECTROSURGICAL) ×1 IMPLANT
FACESHIELD WRAPAROUND (MASK) ×6 IMPLANT
FACESHIELD WRAPAROUND OR TEAM (MASK) ×2 IMPLANT
GLOVE BIO SURGEON STRL SZ7.5 (GLOVE) ×3 IMPLANT
GLOVE BIOGEL PI IND STRL 8 (GLOVE) ×1 IMPLANT
GLOVE BIOGEL PI INDICATOR 8 (GLOVE) ×2
GOWN STRL REUS W/ TWL LRG LVL3 (GOWN DISPOSABLE) ×2 IMPLANT
GOWN STRL REUS W/ TWL XL LVL3 (GOWN DISPOSABLE) ×1 IMPLANT
GOWN STRL REUS W/TWL LRG LVL3 (GOWN DISPOSABLE) ×9
GOWN STRL REUS W/TWL XL LVL3 (GOWN DISPOSABLE) ×3
HEAD BIPOLAR PROS AML 44 (Hips) IMPLANT
KIT BASIN OR (CUSTOM PROCEDURE TRAY) ×3 IMPLANT
KIT ROOM TURNOVER OR (KITS) ×3 IMPLANT
MANIFOLD NEPTUNE II (INSTRUMENTS) ×1 IMPLANT
NS IRRIG 1000ML POUR BTL (IV SOLUTION) ×3 IMPLANT
PACK TOTAL JOINT (CUSTOM PROCEDURE TRAY) ×3 IMPLANT
PAD ARMBOARD 7.5X6 YLW CONV (MISCELLANEOUS) ×8 IMPLANT
STAPLER VISISTAT 35W (STAPLE) ×3 IMPLANT
SUT ETHIBOND 2 V 37 (SUTURE) ×3 IMPLANT
SUT MON AB 2-0 CT1 36 (SUTURE) ×6 IMPLANT
SUT PDS AB 1 CT  36 (SUTURE) ×2
SUT PDS AB 1 CT 36 (SUTURE) ×1 IMPLANT
SUT VIC AB 0 CT1 27 (SUTURE)
SUT VIC AB 0 CT1 27XBRD ANBCTR (SUTURE) IMPLANT
SUT VLOC 180 0 24IN GS25 (SUTURE) ×2 IMPLANT
SYR CONTROL 10ML LL (SYRINGE) ×1 IMPLANT
TOWEL OR 17X24 6PK STRL BLUE (TOWEL DISPOSABLE) ×3 IMPLANT
TOWEL OR 17X26 10 PK STRL BLUE (TOWEL DISPOSABLE) ×3 IMPLANT

## 2016-07-15 NOTE — Anesthesia Procedure Notes (Signed)
Procedure Name: Intubation Date/Time: 07/15/2016 3:48 PM Performed by: Oletta Lamas Pre-anesthesia Checklist: Patient identified, Emergency Drugs available, Suction available and Patient being monitored Patient Re-evaluated:Patient Re-evaluated prior to inductionOxygen Delivery Method: Circle System Utilized Preoxygenation: Pre-oxygenation with 100% oxygen Intubation Type: IV induction Ventilation: Mask ventilation without difficulty Laryngoscope Size: Mac and 3 Grade View: Grade I Tube type: Oral Tube size: 7.5 mm Number of attempts: 1 Airway Equipment and Method: Stylet Placement Confirmation: ETT inserted through vocal cords under direct vision,  positive ETCO2 and breath sounds checked- equal and bilateral Secured at: 22 cm Tube secured with: Tape Dental Injury: Teeth and Oropharynx as per pre-operative assessment

## 2016-07-15 NOTE — Addendum Note (Signed)
Addendum  created 07/15/16 1943 by Oletta Lamas, CRNA   Anesthesia Attestations filed

## 2016-07-15 NOTE — Progress Notes (Signed)
Initial Nutrition Assessment  DOCUMENTATION CODES:   Underweight  INTERVENTION:  Once diet advances, provide Ensure Enlive po BID, each supplement provides 350 kcal and 20 grams of protein.  NUTRITION DIAGNOSIS:   Increased nutrient needs related to  (post op healing) as evidenced by estimated needs.  GOAL:   Patient will meet greater than or equal to 90% of their needs  MONITOR:   Diet advancement, Supplement acceptance, Labs, Weight trends, Skin, I & O's  REASON FOR ASSESSMENT:   Consult Hip fracture protocol  ASSESSMENT:   83 y.o. female with history of hypertension, hyperlipidemia had a fall at her home after patient slipped Patient was having persistent pain in the right hip area and presented to the ER. X-rays reveal right hip fracture.  Pt unavailable and currently in the OR. RD unable to obtain nutrition history. Unable to complete Nutrition-Focused physical exam at this time. RD to perform physical exam at next visit. RD to order nutritional supplements to aid in post op healing. Nursing staff to provide once diet advances.   Labs and medications reviewed.   Diet Order:  Diet NPO time specified Except for: Sips with Meds Diet NPO time specified  Skin:  Reviewed, no issues  Last BM:  Unknown  Height:   Ht Readings from Last 1 Encounters:  07/14/16 5\' 2"  (1.575 m)    Weight:   Wt Readings from Last 1 Encounters:  07/14/16 100 lb (45.4 kg)    Ideal Body Weight:  50 kg  BMI:  Body mass index is 18.29 kg/m.  Estimated Nutritional Needs:   Kcal:  1400-1600  Protein:  60-70 grams  Fluid:  >/= 1.5 L/day  EDUCATION NEEDS:   No education needs identified at this time  Corrin Parker, MS, RD, LDN Pager # [phone] After hours/ weekend pager # [phone]

## 2016-07-15 NOTE — Op Note (Signed)
Rachel Mcguire   [id number]  Pre-op Diagnosis: hip fracture let     Post-op Diagnosis: same   Operative Procedures  1. Open treatment of femoral fracture, proximal end, neck, prosthetic replacement CPT 27236  Personnel  Surgeon(s): Nicholes Stairs, MD   Anesthesia: General  Prosthesis: Depuy Femur: 10 mm Corail with collar KLA Head: 44 mm  Bearing Type: Bipolar  Date of Service: 07/14/2016 - 07/15/2016  Indication: 83 y.o.. year old female who suffered a ground level fall and was found to have sustained a Left hip fracture. The patient was found to have a hip fracture that was appropriate for operative management. We reviewed the risk and benefits with the patient and family and they elected to proceed.  Procedure: After informed consent was obtained and understanding of the risk were voiced including but not limited to bleeding, infection, damage to surrounding structures including nerves and vessels, blood clots, leg length inequality, dislocation and the failure to achieve desired results, including death, the operative extremity was marked with verbal confirmation of the patient in the holding area.   The patient was then brought to the operating room and transported to the operating room table and placed in the lateral decubitus position. The operative limb was then prepped and draped in the usual sterile fashion and preoperative antibiotics were administered.  A time out was performed prior to the start of surgery confirming the correct extremity, preoperative antibiotic administration, as well as team members, and that implants and instruments available for the case. Correct surgical site was also confirmed with preoperative radiographs. A standard posterior approach to the hip was performed. A capsulotomy was performed and the capsule tagged for later repair. The labrum was preserved. The hip was dislocated and the femoral  neck cut was made at approximately 1.5 cm above the level of the lesser trochanter using the femoral neck cutting guide. We then used a corkscrew and cobb to remove the femoral head from the acetabulum. We measured the head and proceeded to trial head sizes and found 43 mm to the the appropriate size. We then turned our attention to femoral preparation. We began sequential broaching to a size 9 stem. This size produced good fit and rotational stability. We placed the trial neck and a 61mm +1.5 mm head.  Leg lengths were evaluated on the table. The hip was stable in extension and external rotation without impingement. The hip was stable in deep flexion. In 110 degrees of flexion and neutral abduction the hip was stable to 90 degrees of internal rotation. In a position of sleep with hip adducted across the body the hip was stable to 90 degrees of rotation.  We then turned back to the femur and tested the broach for stability and fit, and removed the trial broach. At this point time, we noted there was a small crack in the calcar. This did not extend to the level of the lesser trochanter and exited about 7 mm distal to our neck cut. We decided at that point in time to trial up to a 10 stem. With the 10 stem in place there was excellent rotational stability and good stability on trialing. Stability was equivalent to trialing with a 9 and it was no indication for cable at that point in time. The AP diameter of the canal was not compromised by the small calcar split.and the distal fit of the stem produced excellent stability. The final implant was impacted with a  10 stem KLA collar that rested couple of millimeters above the calcar split with excellent distal fit. There was also noted to be excellent rotational stability.   The final femoral implant was placed, a size 10, and the trial head was again trialed for stability. We then irrigated and dried the trunion, and the final head was placed. We placed an 44 mm 1.5  head. The hip was again reduced, and taken through a range of motion and found to be stable as above. The hip was thoroughly irrigated, and a posterior capsular repair was performed with #2 Ethibond. The wound was irrigated with normal saline. The deep fascia was closed with 1 PDS, 0 vicryl for the deep fat layer, and 2.0 Monocryl for the subcutaneous tissue. The skin was closed with staples. A sterile dressing was applied. The patient was awakened and transported to the recovery room in stable condition. All sponge, needle, and instrument counts were correct at the end of the case.  Position: lateral decubitus   Complications: none.  Time Out: performed   Drains/Packing: none  Estimated blood loss: 150 cc  Returned to Recovery Room: in good condition.   Antibiotics: yes   Mechanical VTE (DVT) Prophylaxis: sequential compression devices, TED thigh-high  Chemical VTE (DVT) Prophylaxis: bid asa  Fluid Replacement  Crystalloid: see anesthesia record Blood: none  FFP: none   Specimens Removed: 1 to pathology   Sponge and Instrument Count Correct? yes   PACU: portable radiograph - low AP pelvis  Admission: inpatient status, start PT & OT POD#1  Plan/RTC: Return in 2 weeks for wound check.  Weight Bearing/Load Lower Extremity: full  Posterior hip precautions   Geralynn Rile, [place] 5:05 PM

## 2016-07-15 NOTE — Anesthesia Postprocedure Evaluation (Signed)
Anesthesia Post Note  Patient: Rachel Mcguire  Procedure(s) Performed: Procedure(s) (LRB): ARTHROPLASTY BIPOLAR HIP (HEMIARTHROPLASTY) (Left)  Patient location during evaluation: PACU Anesthesia Type: General Level of consciousness: awake and alert Pain management: pain level controlled Vital Signs Assessment: post-procedure vital signs reviewed and stable Respiratory status: spontaneous breathing, nonlabored ventilation, respiratory function stable and patient connected to nasal cannula oxygen Cardiovascular status: blood pressure returned to baseline and stable Postop Assessment: no signs of nausea or vomiting Anesthetic complications: no       Last Vitals:  Vitals:   07/15/16 1743 07/15/16 1748  BP:    Pulse: 66 62  Resp: 15 10  Temp:      Last Pain:  Vitals:   07/15/16 1728  TempSrc:   PainSc: 0-No pain                 [name] DAVID

## 2016-07-15 NOTE — Anesthesia Preprocedure Evaluation (Addendum)
Anesthesia Evaluation    Airway Mallampati: II  TM Distance: >3 FB Neck ROM: Full    Dental  (+) Teeth Intact   Pulmonary    breath sounds clear to auscultation       Cardiovascular hypertension, + Peripheral Vascular Disease   Rhythm:Regular Rate:Normal     Neuro/Psych  Neuromuscular disease (cognitive dysfunction)    GI/Hepatic   Endo/Other    Renal/GU      Musculoskeletal  (+) Arthritis ,   Abdominal   Peds  Hematology   Anesthesia Other Findings   Reproductive/Obstetrics                            Anesthesia Physical Anesthesia Plan  ASA: III  Anesthesia Plan: General   Post-op Pain Management:    Induction:   Airway Management Planned: Oral ETT and LMA  Additional Equipment:   Intra-op Plan:   Post-operative Plan: Extubation in OR  Informed Consent: I have reviewed the patients History and Physical, chart, labs and discussed the procedure including the risks, benefits and alternatives for the proposed anesthesia with the patient or authorized representative who has indicated his/her understanding and acceptance.   Dental advisory given  Plan Discussed with: CRNA  Anesthesia Plan Comments: (Carotid u/s 12/2015 - RCA < 40% stenosis, LCA open s/p CEA Patient and family prefer [place])       Anesthesia Quick Evaluation

## 2016-07-15 NOTE — Progress Notes (Signed)
PROGRESS NOTE    Rachel Mcguire  GYF:[id number] DOB: 01-18-1932 DOA: 07/14/2016 PCP: Mathews Argyle, MD  Brief Narrative: Rachel Mcguire is a 83 y.o. female with history of hypertension, hyperlipidemia had a fall at her home after patient slipped. Denies  losing consciousness. Patient was having persistent pain in the right hip area and presented to the ER. Denies any chest pain fever chills shortness of breath palpitations.   ED Course: X-rays reveal right hip fracture. Dr. Elmyra Ricks, on-call orthopedic surgeon was consulted. Patient is being admitted for possible surgery.    Assessment & Plan:   Active Problems:   Closed right hip fracture [facility])   Essential hypertension   Closed right hip fracture, initial encounter ([place])   Closed fracture of neck of left femur ([facility])  Left Femoral neck fracture;  X ray; Basicervical fracture of the LEFT femoral neck. Patient denies chest pain or dyspnea.  Moderate risk for surgery.  Transfer to [place] for sx.  DVT prophylaxis per ortho. IV fluids.  IV dilaudid and Robaxin PRN.   Nausea; Zofran PRN.  Suspect related to empty stomach and pain medications.  Will add IV Protonix.    HTN; PRN hydralazine.   Hyperlipidemia and history of carotid endarterectomy - restart statins after surgery.  DVT prophylaxis: SCD.  Code Status: Full code.  Family Communication: Son at bedside.  Disposition Plan:transfer to [facility] for surgical intervention.    Consultants:   Dr Elmyra Ricks   Procedures:   none  Antimicrobials: none  Subjective: Denies chest pain or dyspnea or abdominal pain.  Complaining of nausea, feels she is going to vomit.   Objective: Vitals:   07/14/16 2300 07/15/16 0001 07/15/16 0140 07/15/16 0441  BP:  (!) 182/87 (!) 123/48 (!) 114/58  Pulse:  85 82 77  Resp:  20 (!) 22 19  Temp:  98.5 F (36.9 C) 98.4 F (36.9 C) 98.2 F (36.8 C)  TempSrc:  Axillary Oral Oral  SpO2:  97% 99% 93%  Weight:  45.4 kg (100 lb)     Height: 5\' 2"  (1.575 m)       Intake/Output Summary (Last 24 hours) at 07/15/16 0853 Last data filed at 07/15/16 0600  Gross per 24 hour  Intake              450 ml  Output              250 ml  Net              200 ml   Filed Weights   07/14/16 2300  Weight: 45.4 kg (100 lb)    Examination:  General exam: Appears calm and comfortable  Respiratory system: Clear to auscultation. Respiratory effort normal. Cardiovascular system: S1 & S2 heard, RRR. No JVD, murmurs, rubs, gallops or clicks. No pedal edema. Gastrointestinal system: Abdomen is nondistended, soft and nontender. No organomegaly or masses felt. Normal bowel sounds heard. Central nervous system: Alert and oriented. No focal neurological deficits. Extremities:  Skin: No rashes, lesions or ulcers Psychiatry: Judgement and insight appear normal. Mood & affect appropriate.     Data Reviewed: I have personally reviewed following labs and imaging studies  CBC:  Recent Labs Lab 07/14/16 1729 07/14/16 1741 07/15/16 0429  WBC 6.0  --  9.7  NEUTROABS 4.4  --   --   HGB 14.2 15.3* 13.8  HCT 44.7 45.0 40.9  MCV 89.4  --  86.5  PLT 257  --  258  Basic Metabolic Panel:  Recent Labs Lab 07/14/16 1729 07/14/16 1741 07/15/16 0429  NA 143 142 142  K 3.7 3.5 3.5  CL 107 103 106  CO2 30  --  27  GLUCOSE 128* 125* 142*  BUN 39* 41* 35*  CREATININE 0.95 1.00 0.73  CALCIUM 9.7  --  9.5   GFR: Estimated Creatinine Clearance: 38.9 mL/min (by C-G formula based on SCr of 0.73 mg/dL). Liver Function Tests: No results for input(s): AST, ALT, ALKPHOS, BILITOT, PROT, ALBUMIN in the last 168 hours. No results for input(s): LIPASE, AMYLASE in the last 168 hours. No results for input(s): AMMONIA in the last 168 hours. Coagulation Profile:  Recent Labs Lab 07/14/16 1729  INR 0.91   Cardiac Enzymes: No results for input(s): CKTOTAL, CKMB, CKMBINDEX, TROPONINI in the last 168 hours. BNP (last 3  results) No results for input(s): PROBNP in the last 8760 hours. HbA1C: No results for input(s): HGBA1C in the last 72 hours. CBG: No results for input(s): GLUCAP in the last 168 hours. Lipid Profile: No results for input(s): CHOL, HDL, LDLCALC, TRIG, CHOLHDL, LDLDIRECT in the last 72 hours. Thyroid Function Tests: No results for input(s): TSH, T4TOTAL, FREET4, T3FREE, THYROIDAB in the last 72 hours. Anemia Panel: No results for input(s): VITAMINB12, FOLATE, FERRITIN, TIBC, IRON, RETICCTPCT in the last 72 hours. Sepsis Labs: No results for input(s): PROCALCITON, LATICACIDVEN in the last 168 hours.  No results found for this or any previous visit (from the past 240 hour(s)).       Radiology Studies: Dg Pelvis Portable  Result Date: 07/14/2016 CLINICAL DATA:  Pt brought by EMS for slip and fall, struck bottom on ground. Left leg externally rotated and shortened. EXAM: PORTABLE PELVIS 1-2 VIEWS COMPARISON:  None. FINDINGS: Acute fracture of the LEFT femoral neck with superior migration of the distal femur fragment. There is valgus angulation. Fracture appears at [facility] location. No pelvic fracture identified. IMPRESSION: Basicervical fracture of the LEFT femoral neck. Electronically Signed   By: Suzy Bouchard M.D.   On: 07/14/2016 17:30   Dg Chest Port 1 View  Result Date: 07/14/2016 CLINICAL DATA:  Recent fall, left leg externally rotated and shortened EXAM: PORTABLE CHEST 1 VIEW COMPARISON:  Chest x-ray of 04/05/2011 FINDINGS: No active infiltrate or effusion is seen. Mediastinal and hilar contours are unremarkable. Mild cardiomegaly is stable. Thoracic aortic atherosclerotic calcification is present. There are degenerative changes in the shoulders, right greater than left. IMPRESSION: No active lung disease.  Stable mild cardiomegaly. Electronically Signed   By: Ivar Drape M.D.   On: 07/14/2016 17:22        Scheduled Meds: . pantoprazole (PROTONIX) IV  40 mg Intravenous  Q12H   Continuous Infusions: . sodium chloride 75 mL/hr at 07/15/16 0850     LOS: 1 day    Time spent: 35 minutes.     Elmarie Shiley, MD [facility] Pager [phone]  If 7PM-7AM, please contact night-coverage [email or web address] Password [facility] 07/15/2016, 8:53 AM

## 2016-07-15 NOTE — H&P (Signed)
H&P update  The surgical history has been reviewed and remains accurate without interval change.  The patient was re-examined and patient's physiologic condition has not changed significantly in the last 30 days. The condition still exists that makes this procedure necessary. The treatment plan remains the same, without new options for care.  No new pharmacological allergies or types of therapy has been initiated that would change the plan or the appropriateness of the plan.  The patient and/or family understand the potential benefits and risks.  Shawntae Lowy P. Stann Mainland, MD 07/15/2016 3:14 PM

## 2016-07-15 NOTE — Discharge Instructions (Signed)
-   full weight bearing as tolerated to the left leg -posterior hip precautions for 6 weeks to left leg -ok to maintain post op dressing until follow up appointment and ok to shower with that dressing in place -return to see the surgeon in 2 weeks -take 81 mg aspirin twice daily for 1 month for prevention of blood clots

## 2016-07-15 NOTE — Transfer of Care (Signed)
Immediate Anesthesia Transfer of Care Note  Patient: Rachel Mcguire  Procedure(s) Performed: Procedure(s): ARTHROPLASTY BIPOLAR HIP (HEMIARTHROPLASTY) (Left)  Patient Location: PACU  Anesthesia Type:General  Level of Consciousness: awake, oriented and patient cooperative  Airway & Oxygen Therapy: Patient Spontanous Breathing and Patient connected to nasal cannula oxygen  Post-op Assessment: Report given to RN, Post -op Vital signs reviewed and stable and Patient moving all extremities  Post vital signs: Reviewed and stable  Last Vitals:  Vitals:   07/15/16 0441 07/15/16 1358  BP: (!) 114/58 (!) 154/80  Pulse: 77 71  Resp: 19 15  Temp: 36.8 C 37.1 C    Last Pain:  Vitals:   07/15/16 1358  TempSrc: Oral  PainSc:       Patients Stated Pain Goal: 4 (69/45/03 8882)  Complications: No apparent anesthesia complications

## 2016-07-15 NOTE — Progress Notes (Signed)
Report given to Tomika, RN in short stay. Pt has a wedding ring on that will not come off d/t arthritis.   [place], Rachel Mcguire

## 2016-07-16 DIAGNOSIS — S72002A Fracture of unspecified part of neck of left femur, initial encounter for closed fracture: Principal | ICD-10-CM

## 2016-07-16 LAB — BASIC METABOLIC PANEL
Anion gap: 9 (ref 5–15)
BUN: 20 mg/dL (ref 6–20)
CALCIUM: 8.3 mg/dL — AB (ref 8.9–10.3)
CO2: 26 mmol/L (ref 22–32)
CREATININE: 0.69 mg/dL (ref 0.44–1.00)
Chloride: 103 mmol/L (ref 101–111)
GFR calc Af Amer: >60 mL/min (ref >60)
GLUCOSE: 114 mg/dL — AB (ref 65–99)
Potassium: 3.5 mmol/L (ref 3.5–5.1)
Sodium: 138 mmol/L (ref 135–145)

## 2016-07-16 LAB — TYPE AND SCREEN
ABO/RH(D): A POS
Antibody Screen: NEGATIVE

## 2016-07-16 LAB — CBC
HEMATOCRIT: 37.2 % (ref 36.0–46.0)
Hemoglobin: 12 g/dL (ref 12.0–15.0)
MCH: 29.3 pg (ref 26.0–34.0)
MCHC: 32.3 g/dL (ref 30.0–36.0)
MCV: 90.7 fL (ref 78.0–100.0)
PLATELETS: 185 10*3/uL (ref 150–400)
RBC: 4.1 MIL/uL (ref 3.87–5.11)
RDW: 14.2 % (ref 11.5–15.5)
WBC: 6.5 10*3/uL (ref 4.0–10.5)

## 2016-07-16 MED ORDER — DONEPEZIL HCL 10 MG PO TABS
10.0000 mg | ORAL_TABLET | Freq: Every day | ORAL | Status: DC
  Administered 2016-07-17: 10 mg via ORAL
  Filled 2016-07-16 (×2): qty 1

## 2016-07-16 MED ORDER — POLYETHYLENE GLYCOL 3350 17 G PO PACK
17.0000 g | PACK | Freq: Every day | ORAL | Status: DC
  Administered 2016-07-17: 17 g via ORAL
  Filled 2016-07-16: qty 1

## 2016-07-16 MED ORDER — HALOPERIDOL LACTATE 5 MG/ML IJ SOLN
2.0000 mg | Freq: Four times a day (QID) | INTRAMUSCULAR | Status: DC | PRN
  Administered 2016-07-16: 2 mg via INTRAVENOUS
  Filled 2016-07-16: qty 1

## 2016-07-16 MED ORDER — SENNOSIDES-DOCUSATE SODIUM 8.6-50 MG PO TABS
1.0000 | ORAL_TABLET | Freq: Two times a day (BID) | ORAL | Status: DC
  Administered 2016-07-16 – 2016-07-17 (×3): 1 via ORAL
  Filled 2016-07-16 (×3): qty 1

## 2016-07-16 MED ORDER — HYDROCHLOROTHIAZIDE 25 MG PO TABS
25.0000 mg | ORAL_TABLET | Freq: Every day | ORAL | Status: DC
  Administered 2016-07-16 – 2016-07-18 (×3): 25 mg via ORAL
  Filled 2016-07-16 (×3): qty 1

## 2016-07-16 MED ORDER — QUETIAPINE FUMARATE 25 MG PO TABS
25.0000 mg | ORAL_TABLET | Freq: Every day | ORAL | Status: DC
Start: 2016-07-16 — End: 2016-07-17
  Administered 2016-07-16: 25 mg via ORAL
  Filled 2016-07-16 (×2): qty 1

## 2016-07-16 MED ORDER — POTASSIUM CHLORIDE CRYS ER 20 MEQ PO TBCR
40.0000 meq | EXTENDED_RELEASE_TABLET | ORAL | Status: AC
  Administered 2016-07-16 (×2): 40 meq via ORAL
  Filled 2016-07-16: qty 2

## 2016-07-16 NOTE — Progress Notes (Signed)
Indwelling foley cath removed from pt per physician order.

## 2016-07-16 NOTE — Evaluation (Signed)
Physical Therapy Evaluation Patient Details Name: Rachel Mcguire MRN: [id number] DOB: 09-28-1931 Today's Date: 07/16/2016   History of Present Illness  83 y.o.. year old female who suffered a ground level fall and was found to have sustained a Left hip fracture. Pt had left bipolar hip replacement on 07/15/16.  PMH:  HTN and dementia.   Clinical Impression  Pt admitted with above diagnosis. Pt currently with functional limitations due to the deficits listed below (see PT Problem List). Pt needed +2 max assist for transfer to recliner limited by poor cognition, pain and lethargy.  Has had a steady decline per family over last several months. Lived at [facility] P[facility] and plans to go to SNF there.  Will need input from SW/CM regarding home options after SNF.  Family hopeful they can incr caregivers and have meals brought to pt and her and husband stay in current situation but will need to see how pt progresses. Will follow acutely.  Pt will benefit from skilled PT to increase their independence and safety with mobility to allow discharge to the venue listed below.      Follow Up Recommendations SNF;Supervision/Assistance - 24 hour    Equipment Recommendations  Rolling walker with 5" wheels;Other (comment) (tub bench)    Recommendations for Other Services       Precautions / Restrictions Precautions Precautions: Fall;Posterior Hip Restrictions Weight Bearing Restrictions: Yes LLE Weight Bearing: Weight bearing as tolerated      Mobility  Bed Mobility Overal bed mobility: Needs Assistance Bed Mobility: Supine to Sit     Supine to sit: Max assist;HOB elevated     General bed mobility comments: Pt needed assist for LEs and for elevation of trunk. Had to use pad to scoot pt to EOB.   Transfers Overall transfer level: Needs assistance Equipment used: Rolling walker (2 wheeled);2 person hand held assist Transfers: Sit to/from [facility] Sit to Stand: Max assist;+2  physical assistance;From elevated surface Stand pivot transfers: Max assist;+2 physical assistance;From elevated surface       General transfer comment: Attempted to stand to RW with pt able to stand to RW with max assist but did not have her weight on her left LE fully and could not pivot pt as she was also leaning posteriorly even with max assist.  Sat pt back down and performed a stand pivot transfer with +2 max assist side by side with pt holding onto PT and tech to pivot with pt assisting 25 % with ability to weight bear on her LEs for pivot.   Ambulation/Gait             General Gait Details: unable to attempt due to lethargy, pain and confusion.  Stairs            Wheelchair Mobility    Modified Rankin (Stroke Patients Only)       Balance Overall balance assessment: Needs assistance;History of Falls Sitting-balance support: Bilateral upper extremity supported;Feet supported Sitting balance-Leahy Scale: Poor Sitting balance - Comments: Pt required bil UE support sitting at EOB with right lateral lean and posterior lean.  Mod to max assist most of sitting but could sit for periods of time with min guard assist.  Postural control: Right lateral lean;Posterior lean                                   Pertinent Vitals/Pain Pain Assessment: Faces Faces Pain Scale: Hurts  even more Pain Location: left LE Pain Descriptors / Indicators: Aching;Grimacing;Guarding;Operative site guarding Pain Intervention(s): Limited activity within patient's tolerance;Monitored during session;Repositioned;Premedicated before session;Ice applied  Pt on RA sats 88%-90% therefore replaced O2 at 1.5LO2 with sats >91% with O2 in place.   Home Living Family/patient expects to be discharged to:: Private residence Living Arrangements: Spouse/significant other;Non-relatives/Friends (caregiver 11 hours day; husband immobile) Available Help at Discharge: Family;Available  PRN/intermittently (Family had 11 hours of care arranged PTA) Type of Home: Independent living facility Home Access: Level entry     Home Layout: One level Home Equipment: Grab bars - toilet;Grab bars - tub/shower      Prior Function Level of Independence: Needs assistance   Gait / Transfers Assistance Needed: Pt had begun to have balance issues and had some falls prior to this one.  Had been cued to use RW or cane but pt would not use.    ADL's / Homemaking Assistance Needed: aide had been helping some.  Pt was sponge bathing because getting in tub had gotten hard.        Hand Dominance        Extremity/Trunk Assessment   Upper Extremity Assessment Upper Extremity Assessment: Defer to OT evaluation    Lower Extremity Assessment Lower Extremity Assessment: LLE deficits/detail LLE Deficits / Details: grossly 2-/5 LLE: Unable to fully assess due to pain LLE Sensation: decreased light touch    Cervical / Trunk Assessment Cervical / Trunk Assessment: Kyphotic  Communication   Communication: No difficulties  Cognition Arousal/Alertness: Lethargic;Suspect due to medications Behavior During Therapy: Flat affect Overall Cognitive Status: History of cognitive impairments - at baseline Area of Impairment: Orientation;Attention;Memory;Following commands;Safety/judgement;Awareness;Problem solving Orientation Level: Disoriented to;Place;Time;Situation Current Attention Level: Focused Memory: Decreased short-term memory Following Commands: Follows one step commands inconsistently Safety/Judgement: Decreased awareness of safety;Decreased awareness of deficits   Problem Solving: Slow processing;Decreased initiation;Difficulty sequencing;Requires verbal cues;Requires tactile cues General Comments: Pt was confused and difficult to redirect to task.  Pt had a lot of difficulty following commands but was also lethargic.  Pt at times mean to son accusing son of lying to her but confused.       General Comments      Exercises General Exercises - Lower Extremity Ankle Circles/Pumps: AAROM;Both;5 reps;Supine Quad Sets: AAROM;Both;5 reps;Supine   Assessment/Plan    PT Assessment Patient needs continued PT services  PT Problem List Decreased activity tolerance;Decreased balance;Decreased mobility;Decreased knowledge of use of DME;Decreased safety awareness;Decreased knowledge of precautions;Pain;Decreased strength;Decreased range of motion       PT Treatment Interventions DME instruction;Gait training;Functional mobility training;Therapeutic activities;Therapeutic exercise;Balance training;Patient/family education;Cognitive remediation    PT Goals (Current goals can be found in the Care Plan section)  Acute Rehab PT Goals Patient Stated Goal: to go home PT Goal Formulation: With patient Time For Goal Achievement: 07/30/16 Potential to Achieve Goals: Good    Frequency Min 5X/week   Barriers to discharge Decreased caregiver support elderly husband at home who is immobile.    Co-evaluation               End of Session Equipment Utilized During Treatment: Gait belt;Oxygen Activity Tolerance: Patient limited by fatigue;Patient limited by pain Patient left: in chair;with call bell/phone within reach;with family/visitor present;with chair alarm set Nurse Communication: Mobility status;Need for lift equipment PT Visit Diagnosis: Muscle weakness (generalized) (M62.81)         Time: 7353-2992 PT Time Calculation (min) (ACUTE ONLY): 51 min   Charges:   PT Evaluation $PT Eval Moderate  Complexity: 1 Procedure PT Treatments $Therapeutic Activity: 8-22 mins $Self Care/Home Management: 8-22   PT G Codes:         [place] Pereida F Coby Antrobus 15-Aug-2016, 1:22 PM Stafford Mathius Birkeland,PT Acute Rehabilitation [phone] (pager)

## 2016-07-16 NOTE — NC FL2 (Signed)
[place] LEVEL OF CARE SCREENING TOOL     IDENTIFICATION  Patient Name: Rachel Mcguire Birthdate: [birth year] Sex: female Admission Date (Current Location): 07/14/2016  [facility] and [place] Number:  Herbalist and Address:  [facility] [place], [place] [address], [place], [place] [address]      Provider Number: [id number]  Attending Physician Name and Address:  Lavina Hamman, MD  Relative Name and Phone Number:       Current Level of Care: Hospital Recommended Level of Care: [place] Prior Approval Number:    Date Approved/Denied:   PASRR Number: [id number]  Discharge Plan: SNF    Current Diagnoses: Patient Active Problem List   Diagnosis Date Noted  . Closed left hip fracture, initial encounter ([place]) 07/14/2016  . Essential hypertension 07/14/2016  . Closed fracture of neck of left femur ([place])   . Aftercare following surgery of the circulatory system, [place] 11/20/2013  . Occlusion and stenosis of carotid artery without mention of cerebral infarction 03/11/2011    Orientation RESPIRATION BLADDER Height & Weight     Self  O2 (Nasal Cannula, 2L) Incontinent, Indwelling catheter (Urethal Catheter placed on 3/14. Tube size 14Fr, balloon size 81mL.) Weight: 100 lb (45.4 kg) Height:  5\' 2"  (157.5 cm)  BEHAVIORAL SYMPTOMS/MOOD NEUROLOGICAL BOWEL NUTRITION STATUS      Continent  (Please see d/c summary)  AMBULATORY STATUS COMMUNICATION OF NEEDS Skin   Extensive Assist   Surgical wounds (Closed incision left hip, adhesive bandage)                       Personal Care Assistance Level of Assistance  Bathing, Feeding, Dressing Bathing Assistance: Maximum assistance Feeding assistance: Limited assistance Dressing Assistance: Maximum assistance     Functional Limitations Info  Hearing, Speech, Sight Sight Info: Adequate Hearing Info: Adequate Speech Info: Adequate    SPECIAL CARE FACTORS FREQUENCY  OT (By  licensed OT), PT (By licensed PT)     PT Frequency: 5x week OT Frequency: 5x week            Contractures Contractures Info: Not present    Additional Factors Info  Code Status, Allergies Code Status Info: Full Code Allergies Info: Penicillins           Current Medications (07/16/2016):  This is the current hospital active medication list Current Facility-Administered Medications  Medication Dose Route Frequency Provider Last Rate Last Dose  . acetaminophen (TYLENOL) tablet 650 mg  650 mg Oral Q6H PRN Nicholes Stairs, MD       Or  . acetaminophen (TYLENOL) suppository 650 mg  650 mg Rectal Q6H PRN Nicholes Stairs, MD      . Chlorhexidine Gluconate Cloth 2 % PADS 6 each  6 each Topical Daily Gaynelle Arabian, MD      . donepezil (ARICEPT) tablet 10 mg  10 mg Oral QHS Rise Patience, MD      . enoxaparin (LOVENOX) injection 30 mg  30 mg Subcutaneous Q24H Nicholes Stairs, MD   30 mg at 07/16/16 0954  . feeding supplement (ENSURE ENLIVE) (ENSURE ENLIVE) liquid 237 mL  237 mL Oral BID BM Belkys A Regalado, MD   237 mL at 07/16/16 1505  . haloperidol lactate (HALDOL) injection 2 mg  2 mg Intravenous Q6H PRN Lavina Hamman, MD   2 mg at 07/16/16 0954  . hydrALAZINE (APRESOLINE) injection 10 mg  10 mg Intravenous Q4H  PRN Rise Patience, MD   10 mg at 07/15/16 0009  . hydrochlorothiazide (HYDRODIURIL) tablet 25 mg  25 mg Oral Daily Rise Patience, MD   25 mg at 07/16/16 0954  . HYDROmorphone (DILAUDID) injection 0.5 mg  0.5 mg Intravenous Q2H PRN Belkys A Regalado, MD   0.5 mg at 07/15/16 1255  . methocarbamol (ROBAXIN) 500 mg in dextrose 5 % 50 mL IVPB  500 mg Intravenous Q6H PRN Rhetta Mura Schorr, NP   500 mg at 07/15/16 1004  . methocarbamol (ROBAXIN) tablet 500 mg  500 mg Oral Q6H PRN Nicholes Stairs, MD   500 mg at 07/16/16 0109   Or  . methocarbamol (ROBAXIN) 500 mg in dextrose 5 % 50 mL IVPB  500 mg Intravenous Q6H PRN Nicholes Stairs, MD       . metoCLOPramide (REGLAN) tablet 5-10 mg  5-10 mg Oral Q8H PRN Nicholes Stairs, MD       Or  . metoCLOPramide (REGLAN) injection 5-10 mg  5-10 mg Intravenous Q8H PRN Nicholes Stairs, MD      . morphine 2 MG/ML injection 1 mg  1 mg Intravenous Q2H PRN Nicholes Stairs, MD      . mupirocin ointment (BACTROBAN) 2 % 1 application  1 application Nasal BID Gaynelle Arabian, MD   1 application at 17/71/16 2245  . ondansetron (ZOFRAN) tablet 4 mg  4 mg Oral Q6H PRN Nicholes Stairs, MD       Or  . ondansetron [facility]) injection 4 mg  4 mg Intravenous Q6H PRN Nicholes Stairs, MD      . oxyCODONE (Oxy IR/ROXICODONE) immediate release tablet 5-10 mg  5-10 mg Oral Q3H PRN Nicholes Stairs, MD   10 mg at 07/16/16 1503  . pantoprazole (PROTONIX) injection 40 mg  40 mg Intravenous Q12H Belkys A Regalado, MD   40 mg at 07/16/16 0955  . QUEtiapine (SEROQUEL) tablet 25 mg  25 mg Oral QHS Lavina Hamman, MD         Discharge Medications: Please see discharge summary for a list of discharge medications.  Relevant Imaging Results:  Relevant Lab Results:   Additional Information SSN: [id number]  Eileen Stanford, LCSW

## 2016-07-16 NOTE — Progress Notes (Signed)
[facility] Progress Note  Patient: Rachel Mcguire VEH:[id number]   PCP: Mathews Argyle, MD DOB: 1931-12-01   DOA: 07/14/2016   DOS: 07/16/2016   Date of Service: the patient was seen and examined on 07/16/2016  Subjective: Patient is confused, does not know she is in the hospital, although oriented to herself, family, time. Agitated and requesting to come out of the bed and no acute complaints. No other acute events overnight.  Brief hospital course: Pt. with PMH of HTN, HLD, Alzheimer's dementia; admitted on 07/14/2016, with complaint of fall, was found to have left hip fracture, underwent surgical repair. Currently further plan is postoperative course.  Assessment and Plan: 1. Closed fracture neck of left hip. S/P Open treatment of femoral fracture, proximal end, neck, prosthetic replacement  Postoperative acute blood loss anemia. Patient tolerated the procedure well yesterday. Currently confused likely postoperative delirium. Started on Haldol when necessary as well as scheduled Seroquel. QTC not prolonged anymore, We'll monitor. Twice a day aspirin for DVT prophylaxis with compression stockings. Two-week follow-up with orthopedic, full weightbearing. Posterior hip precautions. Perioperatively on clindamycin due to history of penicillin allergies. Hemoglobin trending from 13.4-12.0, no evidence of active external bleeding, will monitor.  2. Essential hypertension. Currently on home medications.  3. Postoperative delirium, acute encephalopathy. History of dementia. Next and continue Aricept. Seroquel added. When necessary Haldol. Monitor QT.  4. Hypokalemia. Replacing.  Bowel regimen: last BM prior to admission Diet: Cardiac diet DVT Prophylaxis: subcutaneous Heparin  Advance goals of care discussion: Full code  Family Communication: family was present at bedside, at the time of interview. The pt provided permission to discuss medical plan with the family.  Opportunity was given to ask question and all questions were answered satisfactorily.   Disposition:  Discharge to SNF. Expected discharge date: 07/17/2016, improvement in mentation  Consultants: Orthopedics Procedures: Open reduction fracture neck finger  Antibiotics: Anti-infectives    Start     Dose/Rate Route Frequency Ordered Stop   07/15/16 2130  clindamycin (CLEOCIN) IVPB 600 mg     600 mg 100 mL/hr over 30 Minutes Intravenous Every 6 hours 07/15/16 1822 07/16/16 0930   07/15/16 1545  clindamycin (CLEOCIN) IVPB 900 mg     900 mg 100 mL/hr over 30 Minutes Intravenous  Once 07/15/16 1539 07/15/16 1619   07/15/16 1145  ceFAZolin (ANCEF) IVPB 1 g/50 mL premix  Status:  Discontinued     1 g 100 mL/hr over 30 Minutes Intravenous  Once 07/15/16 1126 07/15/16 1813   07/15/16 1130  ceFAZolin (ANCEF) IVPB 2g/100 mL premix  Status:  Discontinued     2 g 200 mL/hr over 30 Minutes Intravenous On call to O.R. 07/15/16 1126 07/15/16 1813       Objective: Physical Exam: Vitals:   07/16/16 0105 07/16/16 0157 07/16/16 0631 07/16/16 1448  BP:  (!) 143/40 132/67 (!) 145/59  Pulse:  60 67 72  Resp:   16 17  Temp: 98.4 F (36.9 C) 99 F (37.2 C) 98.9 F (37.2 C) 97.4 F (36.3 C)  TempSrc: Axillary Axillary Oral Oral  SpO2:  91% 99% 99%  Weight:      Height:        Intake/Output Summary (Last 24 hours) at 07/16/16 1644 Last data filed at 07/16/16 1449  Gross per 24 hour  Intake             2420 ml  Output              800 ml  Net             1620 ml   Filed Weights   07/14/16 2300  Weight: 45.4 kg (100 lb)   General: Alert, Awake and Oriented to Time and Person. Appear in no distress, affect appropriate Eyes: PERRL, Conjunctiva normal ENT: Oral Mucosa clear moist. Neck: no JVD, no Abnormal Mass Or lumps Cardiovascular: S1 and S2 Present, no Murmur, Respiratory: Bilateral Air entry equal and Decreased, no use of accessory muscle, Clear to Auscultation, n Crackles, ono  wheezes Abdomen: Bowel Sound present, Soft and no tenderness Skin: no redness, no Rash, no induration Extremities: no Pedal edema, no calf tenderness Neurologic: Grossly no focal neuro deficit. Bilaterally Equal motor strength  Data Reviewed: CBC:  Recent Labs Lab 07/14/16 1729 07/14/16 1741 07/15/16 0429 07/15/16 1843 07/16/16 0446  WBC 6.0  --  9.7 7.9 6.5  NEUTROABS 4.4  --   --   --   --   HGB 14.2 15.3* 13.8 13.4 12.0  HCT 44.7 45.0 40.9 41.8 37.2  MCV 89.4  --  86.5 91.5 90.7  PLT 257  --  258 197 253   Basic Metabolic Panel:  Recent Labs Lab 07/14/16 1729 07/14/16 1741 07/15/16 0429 07/15/16 1843 07/16/16 0446  NA 143 142 142  --  138  K 3.7 3.5 3.5  --  3.5  CL 107 103 106  --  103  CO2 30  --  27  --  26  GLUCOSE 128* 125* 142*  --  114*  BUN 39* 41* 35*  --  20  CREATININE 0.95 1.00 0.73 0.79 0.69  CALCIUM 9.7  --  9.5  --  8.3*    Liver Function Tests: No results for input(s): AST, ALT, ALKPHOS, BILITOT, PROT, ALBUMIN in the last 168 hours. No results for input(s): LIPASE, AMYLASE in the last 168 hours. No results for input(s): AMMONIA in the last 168 hours. Coagulation Profile:  Recent Labs Lab 07/14/16 1729  INR 0.91   Cardiac Enzymes: No results for input(s): CKTOTAL, CKMB, CKMBINDEX, TROPONINI in the last 168 hours. BNP (last 3 results) No results for input(s): PROBNP in the last 8760 hours. CBG: No results for input(s): GLUCAP in the last 168 hours. Studies: Dg Pelvis Portable  Result Date: 07/15/2016 CLINICAL DATA:  Left hip replacement EXAM: PORTABLE PELVIS 1-2 VIEWS COMPARISON:  07/14/2016 FINDINGS: Coarse calcification in the right pelvis is likely a fibroid. Right femoral head projects in joint. Possible old deformities of the superior pubic rami. Interval left hip replacement with normal alignment. Soft tissue staples and gas consistent with postsurgical change. Vascular calcifications. IMPRESSION: Interval left hip replacement with  expected postsurgical changes. Coarse calcification in the right pelvis presumably a calcified fibroid. Electronically Signed   By: Donavan Foil M.D.   On: 07/15/2016 19:37    Scheduled Meds: . Chlorhexidine Gluconate Cloth  6 each Topical Daily  . donepezil  10 mg Oral QHS  . enoxaparin (LOVENOX) injection  30 mg Subcutaneous Q24H  . feeding supplement (ENSURE ENLIVE)  237 mL Oral BID BM  . hydrochlorothiazide  25 mg Oral Daily  . mupirocin ointment  1 application Nasal BID  . pantoprazole (PROTONIX) IV  40 mg Intravenous Q12H  . QUEtiapine  25 mg Oral QHS   Continuous Infusions: PRN Meds: acetaminophen **OR** acetaminophen, haloperidol lactate, hydrALAZINE, HYDROmorphone (DILAUDID) injection, methocarbamol (ROBAXIN)  IV, methocarbamol **OR** methocarbamol (ROBAXIN)  IV, metoCLOPramide **OR** metoCLOPramide (REGLAN) injection, morphine injection, ondansetron **OR** ondansetron (ZOFRAN) IV, oxyCODONE  Time spent: 30 minutes  Author: Berle Mull, MD [facility] Pager: [phone] 07/16/2016 4:44 PM  If 7PM-7AM, please contact night-coverage at [email or web address], password [facility]

## 2016-07-16 NOTE — Progress Notes (Signed)
OT Cancellation Note  Patient Details Name: Rachel Mcguire MRN: [id number] DOB: 01-Sep-1931   Cancelled Treatment:    Reason Eval/Treat Not Completed: Other (comment) (Defer to SNF) Pt is current D/C plan is SNF. Pt with previous residence at [facility] and to return No apparent immediate acute care OT needs, therefore will defer OT to SNF. If OT eval is needed please call Acute Rehab Dept. at [phone] or text page OT at [phone].    Parke Poisson B 07/16/2016, 2:22 PM

## 2016-07-17 LAB — BASIC METABOLIC PANEL
ANION GAP: 7 (ref 5–15)
BUN: 17 mg/dL (ref 6–20)
CALCIUM: 8.2 mg/dL — AB (ref 8.9–10.3)
CO2: 27 mmol/L (ref 22–32)
Chloride: 104 mmol/L (ref 101–111)
Creatinine, Ser: 0.77 mg/dL (ref 0.44–1.00)
GLUCOSE: 153 mg/dL — AB (ref 65–99)
POTASSIUM: 4 mmol/L (ref 3.5–5.1)
Sodium: 138 mmol/L (ref 135–145)

## 2016-07-17 LAB — CBC
HEMATOCRIT: 38.5 % (ref 36.0–46.0)
HEMOGLOBIN: 12.6 g/dL (ref 12.0–15.0)
MCH: 30.2 pg (ref 26.0–34.0)
MCHC: 32.7 g/dL (ref 30.0–36.0)
MCV: 92.3 fL (ref 78.0–100.0)
Platelets: 170 10*3/uL (ref 150–400)
RBC: 4.17 MIL/uL (ref 3.87–5.11)
RDW: 14.4 % (ref 11.5–15.5)
WBC: 8.4 10*3/uL (ref 4.0–10.5)

## 2016-07-17 MED ORDER — PANTOPRAZOLE SODIUM 40 MG PO TBEC
40.0000 mg | DELAYED_RELEASE_TABLET | Freq: Two times a day (BID) | ORAL | Status: DC
  Administered 2016-07-17 – 2016-07-18 (×2): 40 mg via ORAL
  Filled 2016-07-17 (×2): qty 1

## 2016-07-17 MED ORDER — ASPIRIN EC 325 MG PO TBEC: 325.0000 mg | DELAYED_RELEASE_TABLET | Freq: Two times a day (BID) | ORAL | 0 refills | Status: DC

## 2016-07-17 MED ORDER — OXYCODONE HCL 5 MG PO TABS: 5.0000 mg | ORAL_TABLET | ORAL | 0 refills | Status: DC | PRN

## 2016-07-17 MED ORDER — MAGNESIUM CITRATE PO SOLN
0.5000 | Freq: Once | ORAL | Status: AC
  Administered 2016-07-17: 0.5 via ORAL
  Filled 2016-07-17: qty 296

## 2016-07-17 NOTE — Progress Notes (Addendum)
Subjective: 2 Days Post-Op Procedure(s) (LRB): ARTHROPLASTY BIPOLAR HIP (HEMIARTHROPLASTY) (Left) Patient reports pain as mild.  Pt resting this AM. Family in room. She seems more comfortable and slept well overnight. She has no c/o for me this AM. Per her daughter she has been c/o some constipation. No BM yet.   Objective: Vital signs in last 24 hours: Temp:  [97.4 F (36.3 C)-99 F (37.2 C)] 98.6 F (37 C) (03/17 0700) Pulse Rate:  [71-84] 71 (03/17 0700) Resp:  [17-20] 20 (03/17 0700) BP: (122-159)/(49-64) 122/49 (03/17 0700) SpO2:  [94 %-100 %] 94 % (03/17 0700)  Intake/Output from previous day: 03/16 0701 - 03/17 0700 In: 120 [P.O.:120] Out: 425 [Urine:425] Intake/Output this shift: No intake/output data recorded.   Recent Labs  07/14/16 1729 07/14/16 1741 07/15/16 0429 07/15/16 1843 07/16/16 0446  HGB 14.2 15.3* 13.8 13.4 12.0    Recent Labs  07/15/16 1843 07/16/16 0446  WBC 7.9 6.5  RBC 4.57 4.10  HCT 41.8 37.2  PLT 197 185    Recent Labs  07/15/16 0429 07/15/16 1843 07/16/16 0446  NA 142  --  138  K 3.5  --  3.5  CL 106  --  103  CO2 27  --  26  BUN 35*  --  20  CREATININE 0.73 0.79 0.69  GLUCOSE 142*  --  114*  CALCIUM 9.5  --  8.3*    Recent Labs  07/14/16 1729  INR 0.91    Neurologically intact ABD soft Neurovascular intact Sensation intact distally Intact pulses distally Dorsiflexion/Plantar flexion intact Incision: dressing C/D/I and no drainage No cellulitis present Compartment soft no sign of DVT   Assessment/Plan: 2 Days Post-Op Procedure(s) (LRB): ARTHROPLASTY BIPOLAR HIP (HEMIARTHROPLASTY) (Left) Advance diet Up with therapy D/C IV fluids WBAT Posterior hip precautions ASA 325mg  BID x 4 weeks for DVT ppx- Rx written for ASA and OxyIR for D/C when ready Plan D/C when medically stable per admitting service Add mag citrate due to constipation Stable from ortho standpoint  Rachel Mcguire M. 07/17/2016, 9:42 AM

## 2016-07-17 NOTE — Progress Notes (Signed)
[facility] Progress Note  Patient: Rachel Mcguire PRF:[id number]   PCP: Mathews Argyle, MD DOB: 01/14/1932   DOA: 07/14/2016   DOS: 07/17/2016   Date of Service: the patient was seen and examined on 07/17/2016  Subjective: Increasingly sleepy today, poor oral intake today.  Brief hospital course: Pt. with PMH of HTN, HLD, Alzheimer's dementia; admitted on 07/14/2016, with complaint of fall, was found to have left hip fracture, underwent surgical repair. Currently further plan is to monitor for sleepiness  Assessment and Plan: 1. Closed fracture neck of left hip. S/P Open treatment of femoral fracture, proximal end, neck, prosthetic replacement  Postoperative acute blood loss anemia. Patient tolerated the procedure well yesterday. Currently confused likely postoperative delirium. Started on Haldol when necessary , discontinue scheduled Seroquel due to increased sleepiness. QTC not prolonged anymore, We'll monitor. Twice a day aspirin for DVT prophylaxis with compression stockings. Two-week follow-up with orthopedic, full weightbearing. Posterior hip precautions. Perioperatively on clindamycin due to history of penicillin allergies. Hemoglobin trending from 13.4-12.0, no evidence of active external bleeding, will monitor.  2. Essential hypertension. Currently on home medications.  3. Postoperative delirium, acute encephalopathy. History of dementia. Next and continue Aricept. Seroquel added. Currently discontinued due to increased sleepiness When necessary Haldol. Monitor overnight Monitor QT.  4. Hypokalemia. Replacing.  Bowel regimen: last BM 07/12/2016 Diet: Cardiac diet DVT Prophylaxis: subcutaneous Heparin  Advance goals of care discussion: Full code  Family Communication: family was present at bedside, at the time of interview. The pt provided permission to discuss medical plan with the family. Opportunity was given to ask question and all questions were  answered satisfactorily.   Disposition:  Discharge to SNF. Expected discharge date: 07/18/2016, improvement in mentation  Consultants: Orthopedics Procedures: Open reduction fracture neck finger  Antibiotics: Anti-infectives    Start     Dose/Rate Route Frequency Ordered Stop   07/15/16 2130  clindamycin (CLEOCIN) IVPB 600 mg     600 mg 100 mL/hr over 30 Minutes Intravenous Every 6 hours 07/15/16 1822 07/16/16 0930   07/15/16 1545  clindamycin (CLEOCIN) IVPB 900 mg     900 mg 100 mL/hr over 30 Minutes Intravenous  Once 07/15/16 1539 07/15/16 1619   07/15/16 1145  ceFAZolin (ANCEF) IVPB 1 g/50 mL premix  Status:  Discontinued     1 g 100 mL/hr over 30 Minutes Intravenous  Once 07/15/16 1126 07/15/16 1813   07/15/16 1130  ceFAZolin (ANCEF) IVPB 2g/100 mL premix  Status:  Discontinued     2 g 200 mL/hr over 30 Minutes Intravenous On call to O.R. 07/15/16 1126 07/15/16 1813       Objective: Physical Exam: Vitals:   07/16/16 0631 07/16/16 1448 07/16/16 2100 07/17/16 0700  BP: 132/67 (!) 145/59 (!) 159/64 (!) 122/49  Pulse: 67 72 84 71  Resp: 16 17 20 20   Temp: 98.9 F (37.2 C) 97.4 F (36.3 C) 99 F (37.2 C) 98.6 F (37 C)  TempSrc: Oral Oral Oral Oral  SpO2: 99% 99% 100% 94%  Weight:      Height:        Intake/Output Summary (Last 24 hours) at 07/17/16 1621 Last data filed at 07/17/16 0830  Gross per 24 hour  Intake              236 ml  Output              425 ml  Net             -189  ml   Filed Weights   07/14/16 2300  Weight: 45.4 kg (100 lb)   General: Drowsy and lethargic, unable to follow orientation questions, able to follow command's. Appear in no distress, affect appropriate Eyes: PERRL, Conjunctiva normal ENT: Oral Mucosa clear moist. Neck: no JVD, no Abnormal Mass Or lumps Cardiovascular: S1 and S2 Present, no Murmur, Respiratory: Bilateral Air entry equal and Decreased, no use of accessory muscle, Clear to Auscultation, n Crackles, ono  wheezes Abdomen: Bowel Sound present, Soft and no tenderness Skin: no redness, no Rash, no induration Extremities: no Pedal edema, no calf tenderness  Data Reviewed: CBC:  Recent Labs Lab 07/14/16 1729 07/14/16 1741 07/15/16 0429 07/15/16 1843 07/16/16 0446 07/17/16 1015  WBC 6.0  --  9.7 7.9 6.5 8.4  NEUTROABS 4.4  --   --   --   --   --   HGB 14.2 15.3* 13.8 13.4 12.0 12.6  HCT 44.7 45.0 40.9 41.8 37.2 38.5  MCV 89.4  --  86.5 91.5 90.7 92.3  PLT 257  --  258 197 185 122   Basic Metabolic Panel:  Recent Labs Lab 07/14/16 1729 07/14/16 1741 07/15/16 0429 07/15/16 1843 07/16/16 0446 07/17/16 1015  NA 143 142 142  --  138 138  K 3.7 3.5 3.5  --  3.5 4.0  CL 107 103 106  --  103 104  CO2 30  --  27  --  26 27  GLUCOSE 128* 125* 142*  --  114* 153*  BUN 39* 41* 35*  --  20 17  CREATININE 0.95 1.00 0.73 0.79 0.69 0.77  CALCIUM 9.7  --  9.5  --  8.3* 8.2*    Liver Function Tests: No results for input(s): AST, ALT, ALKPHOS, BILITOT, PROT, ALBUMIN in the last 168 hours. No results for input(s): LIPASE, AMYLASE in the last 168 hours. No results for input(s): AMMONIA in the last 168 hours. Coagulation Profile:  Recent Labs Lab 07/14/16 1729  INR 0.91   Cardiac Enzymes: No results for input(s): CKTOTAL, CKMB, CKMBINDEX, TROPONINI in the last 168 hours. BNP (last 3 results) No results for input(s): PROBNP in the last 8760 hours. CBG: No results for input(s): GLUCAP in the last 168 hours. Studies: No results found.  Scheduled Meds: . Chlorhexidine Gluconate Cloth  6 each Topical Daily  . donepezil  10 mg Oral QHS  . enoxaparin (LOVENOX) injection  30 mg Subcutaneous Q24H  . feeding supplement (ENSURE ENLIVE)  237 mL Oral BID BM  . hydrochlorothiazide  25 mg Oral Daily  . mupirocin ointment  1 application Nasal BID  . pantoprazole  40 mg Oral BID  . polyethylene glycol  17 g Oral Daily  . QUEtiapine  25 mg Oral QHS  . senna-docusate  1 tablet Oral BID    Continuous Infusions: PRN Meds: acetaminophen **OR** acetaminophen, haloperidol lactate, hydrALAZINE, HYDROmorphone (DILAUDID) injection, methocarbamol (ROBAXIN)  IV, methocarbamol **OR** methocarbamol (ROBAXIN)  IV, metoCLOPramide **OR** metoCLOPramide (REGLAN) injection, morphine injection, ondansetron **OR** ondansetron (ZOFRAN) IV, oxyCODONE  Time spent: 30 minutes  Author: Berle Mull, MD [facility] Pager: [phone] 07/17/2016 4:21 PM  If 7PM-7AM, please contact night-coverage at [email or web address], password [facility]

## 2016-07-17 NOTE — Progress Notes (Signed)
Physical Therapy Treatment Patient Details Name: Rachel Mcguire MRN: [id number] DOB: 01-27-1932 Today's Date: 07/17/2016    History of Present Illness 83 y.o.. year old female who suffered a ground level fall and was found to have sustained a Left hip fracture. Pt had left bipolar hip replacement on 07/15/16.  PMH:  HTN and dementia.     PT Comments    Pt making limited progress today with mobility. Continues to be limited by pain, lethargy and cognition. Acute PT to continue during pt's hospital stay.   Follow Up Recommendations  SNF;Supervision/Assistance - 24 hour     Equipment Recommendations  Rolling walker with 5" wheels;Other (comment) (tub bench)    Recommendations for Other Services       Precautions / Restrictions Precautions Precautions: Fall;Posterior Hip Precaution Comments: pt unable to recall precautions (h/o dementia Restrictions Weight Bearing Restrictions: Yes LLE Weight Bearing: Weight bearing as tolerated    Mobility  Bed Mobility Overal bed mobility: Needs Assistance Bed Mobility: Supine to Sit     Supine to sit: Max assist;HOB elevated     General bed mobility comments: Pt needed assist for LEs and for elevation of trunk. Had to use pad to scoot pt to EOB.   Transfers Overall transfer level: Needs assistance Equipment used: Rolling walker (2 wheeled);2 person hand held assist   Sit to Stand: Mod assist;+2 physical assistance;From elevated surface;Max assist Stand pivot transfers: Max assist;+2 physical assistance;From elevated surface       General transfer comment: reattempted to use RW with standing. Able to achieve standing with RW with max assist, however not able to safely move pt with RW so sat pt back down on edge of bed. on second stand had bil support with pt holding onto PTA/tech on their respective sides. performed stand pivot transfer bed to chair with pt taking 2-3 shuffled steps with max assist of 2 people.                                Ambulation/Gait             General Gait Details: unable to attempt due to lethargy, pain and confusion.       Cognition Arousal/Alertness: Lethargic;Suspect due to medications Behavior During Therapy: Flat affect Overall Cognitive Status: History of cognitive impairments - at baseline Area of Impairment: Orientation;Attention;Memory;Following commands;Safety/judgement;Awareness;Problem solving Orientation Level: Disoriented to;Place;Time;Situation Current Attention Level: Focused Memory: Decreased short-term memory Following Commands: Follows one step commands inconsistently Safety/Judgement: Decreased awareness of safety;Decreased awareness of deficits   Problem Solving: Slow processing;Decreased initiation;Difficulty sequencing;Requires verbal cues;Requires tactile cues General Comments: Pt was confused and difficult to redirect to task.  Pt had a lot of difficulty following commands but was also lethargic. Would open eyes to name and converse with short (less than 4 word) answeres    Exercises Total Joint Exercises Ankle Circles/Pumps: AAROM;Strengthening;Left;10 reps;Supine;Limitations Ankle Circles/Pumps Limitations: min to mod active assist with all exercises performed with cues on technique. pt guarding with most motions due to pain. Quad Sets: AAROM;Strengthening;Left;Supine;5 reps Heel Slides: AAROM;Strengthening;Left;Supine;5 reps Hip ABduction/ADduction: AAROM;Strengthening;Left;5 reps;Supine     Pertinent Vitals/Pain Pain Assessment: Faces Faces Pain Scale: Hurts whole lot Pain Location: left LE Pain Descriptors / Indicators: Operative site guarding;Grimacing;Other (Comment) (reaching for hip, stated repeatedly "that hurts") Pain Intervention(s): Limited activity within patient's tolerance;Monitored during session;Premedicated before session;Repositioned;Ice applied     PT Goals (current goals can now be found in the care  plan section) Acute Rehab  PT Goals Patient Stated Goal: to go home PT Goal Formulation: With patient Time For Goal Achievement: 07/30/16 Potential to Achieve Goals: Good Progress towards PT goals: Progressing toward goals    Frequency    Min 5X/week      PT Plan Current plan remains appropriate    End of Session Equipment Utilized During Treatment: Gait belt;Oxygen Activity Tolerance: Patient limited by pain;Patient limited by fatigue Patient left: in chair;with call bell/phone within reach;with family/visitor present Nurse Communication: Mobility status;Need for lift equipment;Patient requests pain meds PT Visit Diagnosis: Unsteadiness on feet (R26.81);Other abnormalities of gait and mobility (R26.89);Muscle weakness (generalized) (M62.81);Repeated falls (R29.6)     Time: 1324-4010 PT Time Calculation (min) (ACUTE ONLY): 19 min  Charges:  $Therapeutic Activity: 8-22 mins                    Willow Ora 07/17/2016, 10:51 AM   Willow Ora, PTA, CLT Acute Rehab Services [phone] 07/17/16, 10:53 AM

## 2016-07-17 NOTE — Care Management Note (Signed)
83 yo F suffered a ground level fall and sustained a L hip fx. s/p L bipolar hip replacement.  Received CM referral to assist with [facility] needs.  PT is recommending SNF placement.  D/C plan is SNF.  SW to assist with SNF placement.

## 2016-07-18 MED ORDER — ENSURE ENLIVE PO LIQD: 237.0000 mL | Freq: Two times a day (BID) | ORAL | 12 refills | Status: DC

## 2016-07-18 MED ORDER — SENNOSIDES-DOCUSATE SODIUM 8.6-50 MG PO TABS: 1.0000 | ORAL_TABLET | Freq: Every evening | ORAL | 0 refills | Status: AC | PRN

## 2016-07-18 MED ORDER — POLYETHYLENE GLYCOL 3350 17 G PO PACK: 17.0000 g | PACK | Freq: Every day | ORAL | 0 refills | Status: AC

## 2016-07-18 NOTE — Clinical Social Work Note (Signed)
Clinical Social Worker facilitated patient discharge including contacting patient family and facility to confirm patient discharge plans.  Clinical information faxed to facility and family agreeable with plan.  CSW arranged ambulance transport via [place] (2:00) to [facility].  RN to call [phone] for report prior to discharge.  Clinical Social Worker will sign off for now as social work intervention is no longer needed. Please consult [place] again if new need arises.  [address], [place]

## 2016-07-18 NOTE — Clinical Social Work Note (Signed)
Clinical Social Work Assessment  Patient Details  Name: Rachel Mcguire MRN: [id number] Date of Birth: October 02, 1931  Date of referral:  07/18/16               Reason for consult:  Facility Placement                Permission sought to share information with:    Permission granted to share information::     Name::     [facility]  Agency::     Relationship::  Daughter  Contact Information:  [phone]  Housing/Transportation Living arrangements for the past 2 months:  [place] of Information:    Patient Interpreter Needed:    Criminal Activity/Legal Involvement Pertinent to Current Situation/Hospitalization:    Significant Relationships:  Adult Children, Spouse Lives with:  Self Do you feel safe going back to the place where you live?    Need for family participation in patient care:     Care giving concerns:  Pt is only alert to self. CSW contacted pt's spouse as well as pt's daughter via telephone.   Social Worker assessment / plan:  CSW spoke with both pt's spouse and pt's daughter via telephone. Per pt's daughter pt was living at [facility]. Pt's daughter stated pt was living in an apartment of [place]. Pt's daughter as well as spouse want pt to go to [facility] SNF. CSW spoke with facility and they are holding a bed for pt. CSW will set up PTAR at d/c.  Employment status:  Retired Nurse, adult PT Recommendations:  [place] / Referral to community resources:  [place]  Patient/Family's Response to care:  Pt's daughter verbalized understanding of CSW role and expressed appreciation for support. Pt's daughter denies any concern regarding pt care at this time.    Patient/Family's Understanding of and Emotional Response to Diagnosis, Current Treatment, and Prognosis:  Pt's duaghter understanding and realistic regarding physical limitations. Pt's daughter  understands the need for SNF placement at d/c. Pt's daughter agreeable to SNF placement at d/c, at this time. Pt's daughter denies any concern regarding treatment plan at this time. CSW will continue to provide support and facilitate d/c needs.   Emotional Assessment Appearance:  Appears stated age Attitude/Demeanor/Rapport:  Unable to Assess Affect (typically observed):  Unable to Assess Orientation:  Oriented to Self Alcohol / Substance use:  Not Applicable Psych involvement (Current and /or in the community):  No (Comment)  Discharge Needs  Concerns to be addressed:  No discharge needs identified Readmission within the last 30 days:  No Current discharge risk:  Dependent with Mobility Barriers to Discharge:  Continued Medical Work up   [employer], LCSW 07/18/2016, 10:58 AM

## 2016-07-18 NOTE — Progress Notes (Signed)
   Subjective:  Patient reports pain as mild to moderate.  Denies N/V/CP/SOB. C/O L hip pain.  Objective:   VITALS:   Vitals:   07/16/16 2100 07/17/16 0700 07/17/16 2130 07/18/16 0611  BP: (!) 159/64 (!) 122/49 136/63 (!) 166/69  Pulse: 84 71 88 81  Resp: 20 20 20 17   Temp: 99 F (37.2 C) 98.6 F (37 C) 98.8 F (37.1 C) 98.7 F (37.1 C)  TempSrc: Oral Oral Oral Oral  SpO2: 100% 94% 94% 98%  Weight:      Height:        ABD soft Sensation intact distally Intact pulses distally Dorsiflexion/Plantar flexion intact Incision: dressing C/D/I Compartment soft   Lab Results  Component Value Date   WBC 8.4 07/17/2016   HGB 12.6 07/17/2016   HCT 38.5 07/17/2016   MCV 92.3 07/17/2016   PLT 170 07/17/2016   BMET    Component Value Date/Time   NA 138 07/17/2016 1015   K 4.0 07/17/2016 1015   CL 104 07/17/2016 1015   CO2 27 07/17/2016 1015   GLUCOSE 153 (H) 07/17/2016 1015   BUN 17 07/17/2016 1015   CREATININE 0.77 07/17/2016 1015   CREATININE 0.75 03/02/2011 1546   CALCIUM 8.2 (L) 07/17/2016 1015   GFRNONAA >60 07/17/2016 1015   GFRAA >60 07/17/2016 1015     Assessment/Plan: 3 Days Post-Op   Active Problems:   Closed left hip fracture, initial encounter (HCC)   Essential hypertension   Closed fracture of neck of left femur (HCC)   WBAT with walker Posterior hip precautions PT/OT DVT ppx: ASA, SCDs, TEDs PO pain control Dispo: ok for d/c from ortho standpoint    Raeanna Soberanes, Horald Pollen 07/18/2016, 9:55 AM   Rod Can, MD Cell [phone]

## 2016-07-18 NOTE — Clinical Social Work Placement (Signed)
   CLINICAL SOCIAL WORK PLACEMENT  NOTE  Date:  07/18/2016  Patient Details  Name: Rachel Mcguire MRN: [id number] Date of Birth: 04-09-1932  Clinical Social Work is seeking post-discharge placement for this patient at the [place] level of care (*CSW will initial, date and re-position this form in  chart as items are completed):      Patient/family provided with [place] Work Department's list of facilities offering this level of care within the geographic area requested by the patient (or if unable, by the patient's family).  Yes   Patient/family informed of their freedom to choose among providers that offer the needed level of care, that participate in Medicare, Medicaid or managed care program needed by the patient, have an available bed and are willing to accept the patient.      Patient/family informed of [facility]'s ownership interest in [facility] and [facility], as well as of the fact that they are under no obligation to receive care at these facilities.  PASRR submitted to EDS on       PASRR number received on 07/16/16     Existing PASRR number confirmed on       FL2 transmitted to all facilities in geographic area requested by pt/family on 07/16/16     FL2 transmitted to all facilities within larger geographic area on       Patient informed that his/her managed care company has contracts with or will negotiate with certain facilities, including the following:        Yes   Patient/family informed of bed offers received.  Patient chooses bed at [facility]     Physician recommends and patient chooses bed at      Patient to be transferred to [facility] on 07/18/16.  Patient to be transferred to facility by PTAR     Patient family notified on 07/18/16 of transfer.  Name of family member notified:  Fredrik Rigger     PHYSICIAN       Additional Comment:    _______________________________________________ Eileen Stanford, LCSW 07/18/2016, 11:02 AM

## 2016-07-18 NOTE — Discharge Summary (Signed)
[facility] Discharge Summary   Patient: Rachel Mcguire:[id number]   PCP: Mathews Argyle, MD DOB: 1931/06/15   Date of admission: 07/14/2016   Date of discharge:  07/18/2016    Discharge Diagnoses:  Active Problems:   Closed left hip fracture, initial encounter [facility])   Essential hypertension   Closed fracture of neck of left femur ([place])   Admitted From: SNF Disposition:  SNF  Recommendations for Outpatient Follow-up:  1. Please follow up with PCP in 1 week and orthopedic as Recommended   Follow-up Information    Nicholes Stairs, MD. Schedule an appointment as soon as possible for a visit in 2 week(s).   Specialty:  Orthopedic Surgery Contact information: [address] [place] [address] [phone]        Mathews Argyle, MD. Schedule an appointment as soon as possible for a visit in 1 week(s).   Specialty:  Internal Medicine Contact information: [address] [place] [address] [phone]          Diet recommendation: cardiac diet  Activity: The patient is advised to gradually reintroduce usual activities.  Discharge Condition: good  Code Status: full code  History of present illness: As per the H and P dictated on admission, " Rachel Mcguire is a 83 y.o. female with history of hypertension, hyperlipidemia had a fall at her home after patient slipped. Denies CT her head or losing consciousness. Patient was having persistent pain in the right hip area and presented to the ER. Denies any chest pain fever chills shortness of breath palpitations. "  Hospital Course:   Summary of her active problems in the hospital is as following. 1. Closed fracture neck of left hip. S/P Open treatment of femoral fracture, proximal end, neck, prosthetic replacement  Postoperative acute blood loss anemia. Patient tolerated the procedure well yesterday. Currently confused likely postoperative delirium. Started on  Haldol when necessary , discontinue scheduled Seroquel due to increased sleepiness. QTC not prolonged anymore, We'll monitor. Twice a day aspirin for DVT prophylaxis with compression stockings. Two-week follow-up with orthopedic, full weightbearing. Posterior hip precautions. Perioperatively on clindamycin due to history of penicillin allergies. Hemoglobin trending from 13.4-12.0, no evidence of active external bleeding, will monitor.  2. Essential hypertension. Currently on home medications.  3. Postoperative delirium, acute encephalopathy. Resolved  History of dementia. continue Aricept. Seroquel was added but due to somnolence stopped.  4. Hypokalemia. Resolved.  All other chronic medical condition were stable during the hospitalization.  Patient was seen by physical therapy, who recommended SNF, which was arranged by Education officer, museum and case Freight forwarder. On the day of the discharge the patient's vitals were stable, and no other acute medical condition were reported by patient. the patient was felt safe to be discharge at SNF with therapy.  Procedures and Results:  S/P Open treatment of femoral fracture,   Consultations:  Orthopedics   DISCHARGE MEDICATION: Current Discharge Medication List    START taking these medications   Details  aspirin EC 325 MG tablet Take 1 tablet (325 mg total) by mouth 2 (two) times daily. Qty: 60 tablet, Refills: 0    feeding supplement, ENSURE ENLIVE, (ENSURE ENLIVE) LIQD Take 237 mLs by mouth 2 (two) times daily between meals. Qty: 237 mL, Refills: 12    oxyCODONE (OXY IR/ROXICODONE) 5 MG immediate release tablet Take 1-2 tablets (5-10 mg total) by mouth every 4 (four) hours as needed for breakthrough pain. Qty: 50 tablet, Refills: 0  polyethylene glycol (MIRALAX / GLYCOLAX) packet Take 17 g by mouth daily. Qty: 14 each, Refills: 0    senna-docusate (SENOKOT-S) 8.6-50 MG tablet Take 1 tablet by mouth at bedtime as needed for mild  constipation. Qty: 30 tablet, Refills: 0      CONTINUE these medications which have NOT CHANGED   Details  atorvastatin (LIPITOR) 10 MG tablet Take 10 mg by mouth daily.    donepezil (ARICEPT) 10 MG tablet Take 10 mg by mouth at bedtime.     hydrochlorothiazide 25 MG tablet Take 25 mg by mouth daily.        STOP taking these medications     aspirin 81 MG tablet        Allergies  Allergen Reactions  . Penicillins Itching and Rash    Has patient had a PCN reaction causing immediate rash, facial/tongue/throat swelling, SOB or lightheadedness with hypotension: unknown Has patient had a PCN reaction causing severe rash involving mucus membranes or skin necrosis: unknown Has patient had a PCN reaction that required hospitalization: unknown Has patient had a PCN reaction occurring within the last 10 years: no If all of the above answers are "NO", then may proceed with Cephalosporin use.   Discharge Instructions    Diet - low sodium heart healthy    Complete by:  As directed    Discharge instructions    Complete by:  As directed    It is important that you read following instructions as well as go over your medication list with RN to help you understand your care after this hospitalization.  Discharge Instructions: Please follow-up with PCP in one week  Please request your primary care physician to go over all Hospital Tests and Procedure/Radiological results at the follow up,  Please get all Hospital records sent to your PCP by signing hospital release before you go home.   Do not drive, operating heavy machinery, perform activities at heights, swimming or participation in water activities or provide baby sitting services while your are on Pain, Sleep and Anxiety Medications; until you have been seen by Primary Care Physician or a Neurologist and advised to do so again. Do not take more than prescribed Pain, Sleep and Anxiety Medications. You were cared for by a hospitalist during  your hospital stay. If you have any questions about your discharge medications or the care you received while you were in the hospital after you are discharged, you can call the unit and ask to speak with the hospitalist on call if the hospitalist that took care of you is not available.  Once you are discharged, your primary care physician will handle any further medical issues. Please note that NO REFILLS for any discharge medications will be authorized once you are discharged, as it is imperative that you return to your primary care physician (or establish a relationship with a primary care physician if you do not have one) for your aftercare needs so that they can reassess your need for medications and monitor your lab values. You Must read complete instructions/literature along with all the possible adverse reactions/side effects for all the Medicines you take and that have been prescribed to you. Take any new Medicines after you have completely understood and accept all the possible adverse reactions/side effects. Wear Seat belts while driving.   Increase activity slowly    Complete by:  As directed      Discharge Exam: Filed Weights   07/14/16 2300  Weight: 45.4 kg (100 lb)  Vitals:   07/17/16 2130 07/18/16 0611  BP: 136/63 (!) 166/69  Pulse: 88 81  Resp: 20 17  Temp: 98.8 F (37.1 C) 98.7 F (37.1 C)   General: Appear in no distress, no Rash; Oral Mucosa moist. Cardiovascular: S1 and S2 Present, no Murmur, no JVD Respiratory: Bilateral Air entry present and Clear to Auscultation, no Crackles, no wheezes Abdomen: Bowel Sound present, Soft and no tenderness Extremities: no Pedal edema, no calf tenderness Neurology: Grossly no focal neuro deficit.  The results of significant diagnostics from this hospitalization (including imaging, microbiology, ancillary and laboratory) are listed below for reference.    Significant Diagnostic Studies: Dg Pelvis Portable  Result Date:  07/15/2016 CLINICAL DATA:  Left hip replacement EXAM: PORTABLE PELVIS 1-2 VIEWS COMPARISON:  07/14/2016 FINDINGS: Coarse calcification in the right pelvis is likely a fibroid. Right femoral head projects in joint. Possible old deformities of the superior pubic rami. Interval left hip replacement with normal alignment. Soft tissue staples and gas consistent with postsurgical change. Vascular calcifications. IMPRESSION: Interval left hip replacement with expected postsurgical changes. Coarse calcification in the right pelvis presumably a calcified fibroid. Electronically Signed   By: Donavan Foil M.D.   On: 07/15/2016 19:37   Dg Pelvis Portable  Result Date: 07/14/2016 CLINICAL DATA:  Pt brought by EMS for slip and fall, struck bottom on ground. Left leg externally rotated and shortened. EXAM: PORTABLE PELVIS 1-2 VIEWS COMPARISON:  None. FINDINGS: Acute fracture of the LEFT femoral neck with superior migration of the distal femur fragment. There is valgus angulation. Fracture appears at [facility] location. No pelvic fracture identified. IMPRESSION: Basicervical fracture of the LEFT femoral neck. Electronically Signed   By: Suzy Bouchard M.D.   On: 07/14/2016 17:30   Dg Chest Port 1 View  Result Date: 07/14/2016 CLINICAL DATA:  Recent fall, left leg externally rotated and shortened EXAM: PORTABLE CHEST 1 VIEW COMPARISON:  Chest x-ray of 04/05/2011 FINDINGS: No active infiltrate or effusion is seen. Mediastinal and hilar contours are unremarkable. Mild cardiomegaly is stable. Thoracic aortic atherosclerotic calcification is present. There are degenerative changes in the shoulders, right greater than left. IMPRESSION: No active lung disease.  Stable mild cardiomegaly. Electronically Signed   By: Ivar Drape M.D.   On: 07/14/2016 17:22    Microbiology: Recent Results (from the past 240 hour(s))  Surgical PCR screen     Status: Abnormal   Collection Time: 07/15/16  5:49 AM  Result Value Ref Range  Status   MRSA, PCR NEGATIVE NEGATIVE Final   Staphylococcus aureus POSITIVE (A) NEGATIVE Final    Comment:        The Xpert SA Assay (FDA approved for NASAL specimens in patients over 57 years of age), is one component of a comprehensive surveillance program.  Test performance has been validated by [facility] for patients greater than or equal to 4 year old. It is not intended to diagnose infection nor to guide or monitor treatment.      Labs: CBC:  Recent Labs Lab 07/14/16 1729 07/14/16 1741 07/15/16 0429 07/15/16 1843 07/16/16 0446 07/17/16 1015  WBC 6.0  --  9.7 7.9 6.5 8.4  NEUTROABS 4.4  --   --   --   --   --   HGB 14.2 15.3* 13.8 13.4 12.0 12.6  HCT 44.7 45.0 40.9 41.8 37.2 38.5  MCV 89.4  --  86.5 91.5 90.7 92.3  PLT 257  --  258 197 185 564   Basic Metabolic Panel:  Recent Labs Lab 07/14/16 1729 07/14/16 1741 07/15/16 0429 07/15/16 1843 07/16/16 0446 07/17/16 1015  NA 143 142 142  --  138 138  K 3.7 3.5 3.5  --  3.5 4.0  CL 107 103 106  --  103 104  CO2 30  --  27  --  26 27  GLUCOSE 128* 125* 142*  --  114* 153*  BUN 39* 41* 35*  --  20 17  CREATININE 0.95 1.00 0.73 0.79 0.69 0.77  CALCIUM 9.7  --  9.5  --  8.3* 8.2*   Time spent: 30 minutes  Signed:  Aerica Rincon  [facility]  07/18/2016  , 9:55 AM

## 2016-07-19 ENCOUNTER — Encounter (HOSPITAL_COMMUNITY): Payer: Self-pay | Admitting: Orthopedic Surgery

## 2017-02-09 ENCOUNTER — Ambulatory Visit
Admission: RE | Admit: 2017-02-09 | Discharge: 2017-02-09 | Disposition: A | Discharge status: Home / Self Care | Payer: Medicare Other | Source: Ambulatory Visit | Attending: Geriatric Medicine | Admitting: Geriatric Medicine

## 2017-02-09 ENCOUNTER — Other Ambulatory Visit: Payer: Self-pay | Admitting: Geriatric Medicine

## 2017-02-09 DIAGNOSIS — M545 Low back pain, unspecified: Secondary | ICD-10-CM

## 2017-06-14 ENCOUNTER — Ambulatory Visit: Payer: Medicare Other | Admitting: Family

## 2017-06-14 ENCOUNTER — Encounter (HOSPITAL_COMMUNITY): Payer: Medicare Other

## 2018-05-17 IMAGING — DX DG PORTABLE PELVIS
1 series · 1 of 1 positions shown · non-contrast
Comparison: None.

CLINICAL DATA: Pt brought by EMS for slip and fall, struck bottom
on ground. Left leg externally rotated and shortened.

EXAM:
PORTABLE PELVIS 1-2 VIEWS

[pelvis ap]
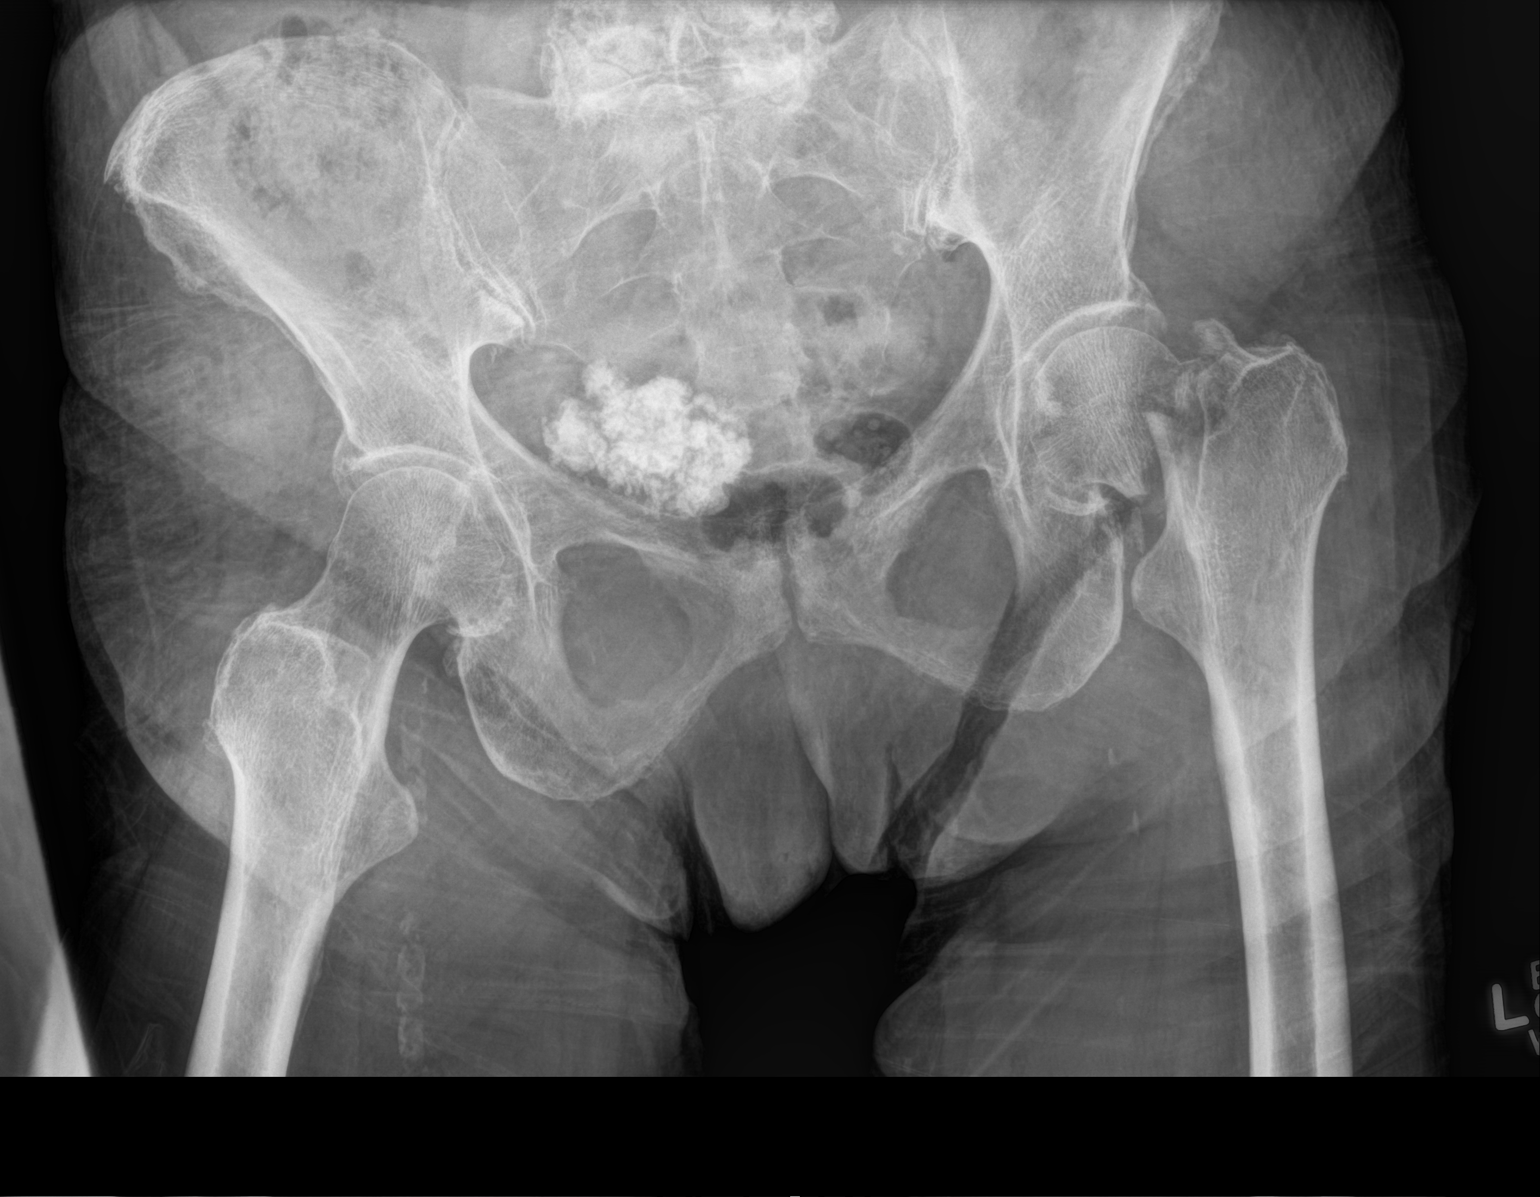

[1 of 1 positions shown; findings below may reference images not displayed]

FINDINGS: Acute fracture of the LEFT femoral neck with superior migration of
the distal femur fragment. There is valgus angulation. Fracture
appears at Basicervical location. No pelvic fracture identified.
IMPRESSION: Basicervical fracture of the LEFT femoral neck.

## 2018-05-17 IMAGING — DX DG CHEST 1V PORT
1 series · 1 of 1 positions shown · non-contrast
Comparison: Chest x-ray of 04/05/2011

CLINICAL DATA: Recent fall, left leg externally rotated and
shortened

EXAM:
PORTABLE CHEST 1 VIEW

[chest ap]
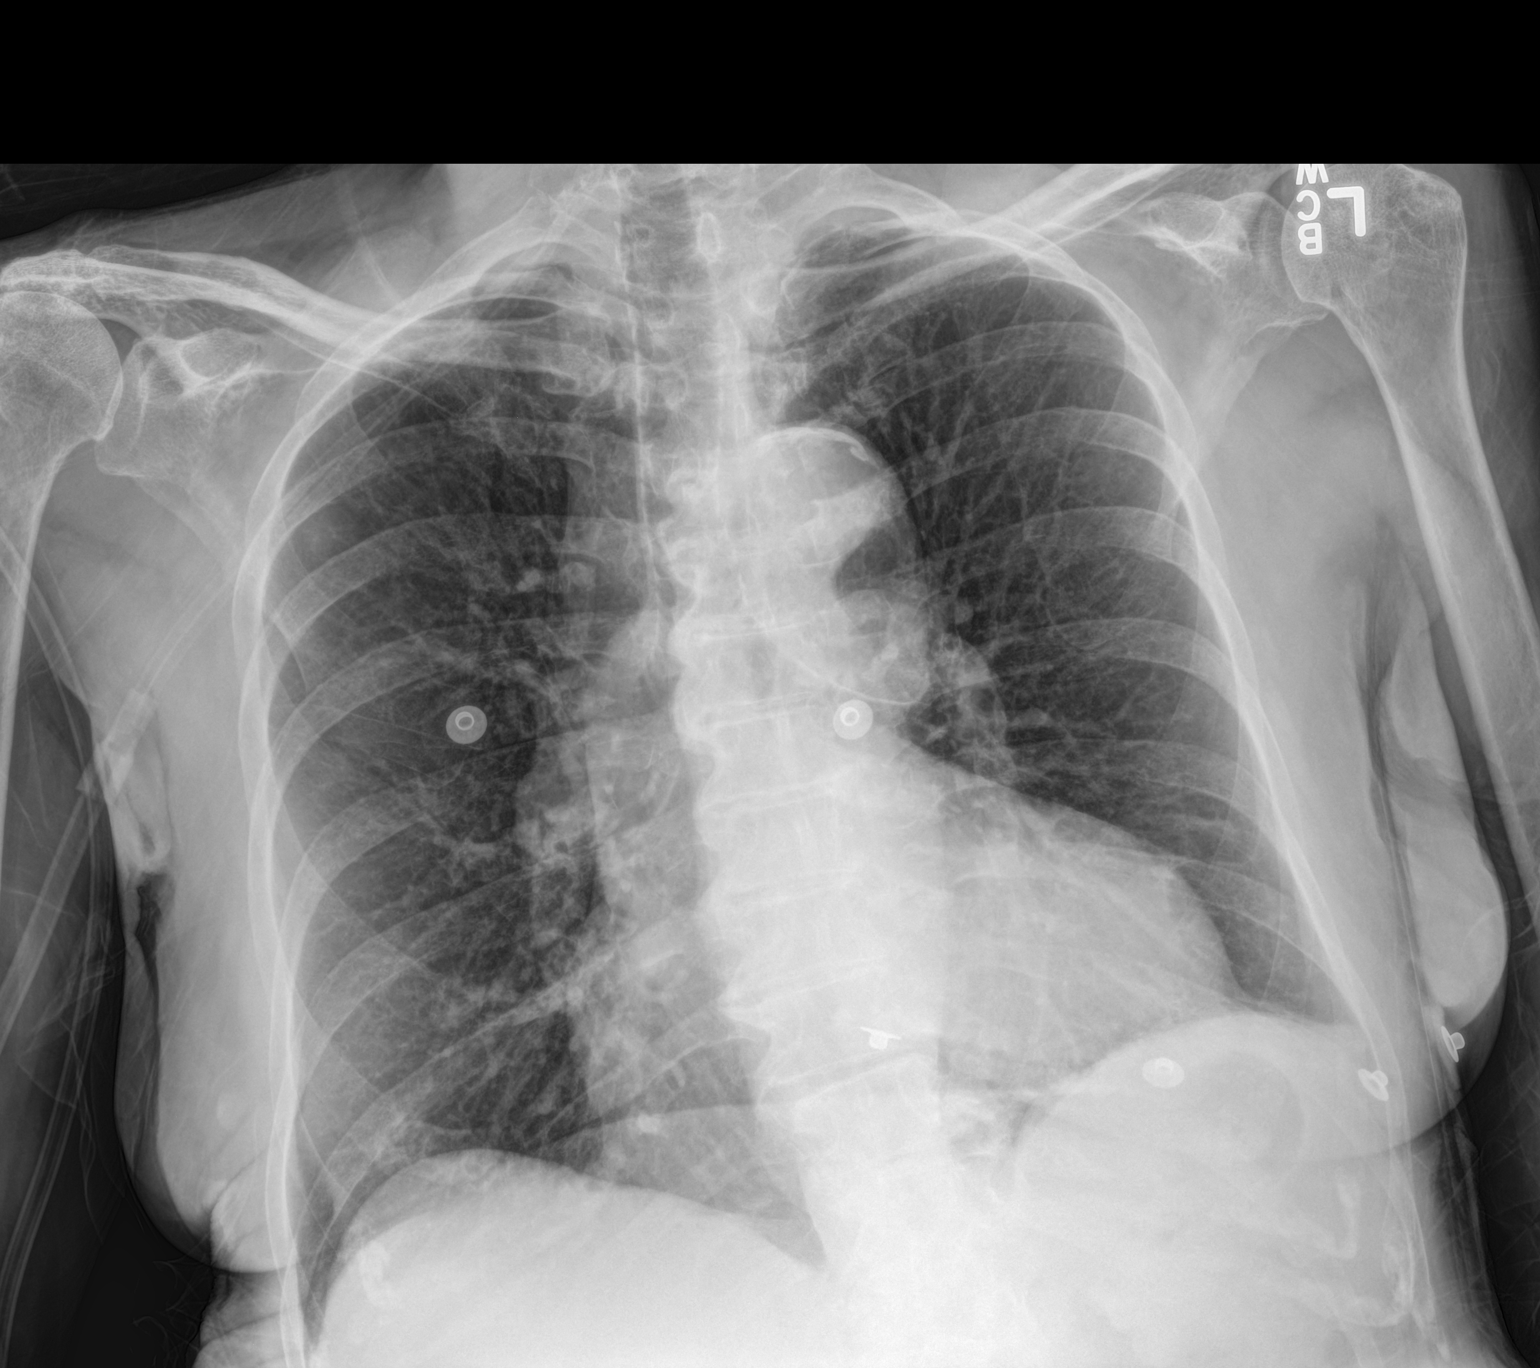

[1 of 1 positions shown; findings below may reference images not displayed]

FINDINGS: No active infiltrate or effusion is seen. Mediastinal and hilar
contours are unremarkable. Mild cardiomegaly is stable. Thoracic
aortic atherosclerotic calcification is present. There are
degenerative changes in the shoulders, right greater than left.
IMPRESSION: No active lung disease.  Stable mild cardiomegaly.

## 2019-08-30 ENCOUNTER — Encounter (HOSPITAL_COMMUNITY): Payer: Self-pay | Admitting: Internal Medicine

## 2019-08-30 ENCOUNTER — Emergency Department (HOSPITAL_COMMUNITY): Payer: Medicare PPO

## 2019-08-30 ENCOUNTER — Inpatient Hospital Stay (HOSPITAL_COMMUNITY): Payer: Medicare PPO

## 2019-08-30 ENCOUNTER — Inpatient Hospital Stay (HOSPITAL_COMMUNITY)
Admission: EM | Admit: 2019-08-30 | Discharge: 2019-09-04 | DRG: 064 | Disposition: A | Discharge status: Skilled Nursing Facility | Payer: Medicare PPO | Source: Ambulatory Visit | Attending: Internal Medicine | Admitting: Internal Medicine

## 2019-08-30 ENCOUNTER — Other Ambulatory Visit: Payer: Self-pay

## 2019-08-30 DIAGNOSIS — Z96642 Presence of left artificial hip joint: Secondary | ICD-10-CM | POA: Diagnosis present

## 2019-08-30 DIAGNOSIS — R29810 Facial weakness: Secondary | ICD-10-CM | POA: Diagnosis present

## 2019-08-30 DIAGNOSIS — E785 Hyperlipidemia, unspecified: Secondary | ICD-10-CM | POA: Diagnosis present

## 2019-08-30 DIAGNOSIS — M1990 Unspecified osteoarthritis, unspecified site: Secondary | ICD-10-CM | POA: Diagnosis present

## 2019-08-30 DIAGNOSIS — N3281 Overactive bladder: Secondary | ICD-10-CM | POA: Diagnosis present

## 2019-08-30 DIAGNOSIS — Z8249 Family history of ischemic heart disease and other diseases of the circulatory system: Secondary | ICD-10-CM

## 2019-08-30 DIAGNOSIS — N390 Urinary tract infection, site not specified: Secondary | ICD-10-CM | POA: Diagnosis present

## 2019-08-30 DIAGNOSIS — I351 Nonrheumatic aortic (valve) insufficiency: Secondary | ICD-10-CM | POA: Diagnosis not present

## 2019-08-30 DIAGNOSIS — R414 Neurologic neglect syndrome: Secondary | ICD-10-CM | POA: Diagnosis present

## 2019-08-30 DIAGNOSIS — I10 Essential (primary) hypertension: Secondary | ICD-10-CM | POA: Diagnosis present

## 2019-08-30 DIAGNOSIS — Z79891 Long term (current) use of opiate analgesic: Secondary | ICD-10-CM

## 2019-08-30 DIAGNOSIS — E860 Dehydration: Secondary | ICD-10-CM | POA: Diagnosis present

## 2019-08-30 DIAGNOSIS — Z20822 Contact with and (suspected) exposure to covid-19: Secondary | ICD-10-CM | POA: Diagnosis present

## 2019-08-30 DIAGNOSIS — Z823 Family history of stroke: Secondary | ICD-10-CM | POA: Diagnosis not present

## 2019-08-30 DIAGNOSIS — D72829 Elevated white blood cell count, unspecified: Secondary | ICD-10-CM | POA: Diagnosis not present

## 2019-08-30 DIAGNOSIS — F0390 Unspecified dementia without behavioral disturbance: Secondary | ICD-10-CM | POA: Diagnosis present

## 2019-08-30 DIAGNOSIS — Z66 Do not resuscitate: Secondary | ICD-10-CM | POA: Diagnosis present

## 2019-08-30 DIAGNOSIS — I739 Peripheral vascular disease, unspecified: Secondary | ICD-10-CM | POA: Diagnosis present

## 2019-08-30 DIAGNOSIS — M810 Age-related osteoporosis without current pathological fracture: Secondary | ICD-10-CM | POA: Diagnosis present

## 2019-08-30 DIAGNOSIS — Z803 Family history of malignant neoplasm of breast: Secondary | ICD-10-CM

## 2019-08-30 DIAGNOSIS — I63411 Cerebral infarction due to embolism of right middle cerebral artery: Principal | ICD-10-CM | POA: Diagnosis present

## 2019-08-30 DIAGNOSIS — E876 Hypokalemia: Secondary | ICD-10-CM | POA: Diagnosis present

## 2019-08-30 DIAGNOSIS — I160 Hypertensive urgency: Secondary | ICD-10-CM | POA: Diagnosis not present

## 2019-08-30 DIAGNOSIS — B961 Klebsiella pneumoniae [K. pneumoniae] as the cause of diseases classified elsewhere: Secondary | ICD-10-CM | POA: Diagnosis present

## 2019-08-30 DIAGNOSIS — I361 Nonrheumatic tricuspid (valve) insufficiency: Secondary | ICD-10-CM | POA: Diagnosis not present

## 2019-08-30 DIAGNOSIS — I639 Cerebral infarction, unspecified: Secondary | ICD-10-CM | POA: Diagnosis present

## 2019-08-30 DIAGNOSIS — R4701 Aphasia: Secondary | ICD-10-CM | POA: Diagnosis present

## 2019-08-30 DIAGNOSIS — F0150 Vascular dementia without behavioral disturbance: Secondary | ICD-10-CM | POA: Diagnosis present

## 2019-08-30 DIAGNOSIS — Z85828 Personal history of other malignant neoplasm of skin: Secondary | ICD-10-CM | POA: Diagnosis not present

## 2019-08-30 DIAGNOSIS — R1310 Dysphagia, unspecified: Secondary | ICD-10-CM | POA: Diagnosis present

## 2019-08-30 DIAGNOSIS — Z880 Allergy status to penicillin: Secondary | ICD-10-CM | POA: Diagnosis not present

## 2019-08-30 DIAGNOSIS — I63511 Cerebral infarction due to unspecified occlusion or stenosis of right middle cerebral artery: Secondary | ICD-10-CM | POA: Diagnosis not present

## 2019-08-30 DIAGNOSIS — I6601 Occlusion and stenosis of right middle cerebral artery: Secondary | ICD-10-CM | POA: Diagnosis not present

## 2019-08-30 DIAGNOSIS — G9341 Metabolic encephalopathy: Secondary | ICD-10-CM | POA: Diagnosis not present

## 2019-08-30 DIAGNOSIS — Z7982 Long term (current) use of aspirin: Secondary | ICD-10-CM

## 2019-08-30 DIAGNOSIS — Z79899 Other long term (current) drug therapy: Secondary | ICD-10-CM

## 2019-08-30 LAB — CBC WITH DIFFERENTIAL/PLATELET
Abs Immature Granulocytes: 0.04 10*3/uL (ref 0.00–0.07)
Basophils Absolute: 0 10*3/uL (ref 0.0–0.1)
Basophils Relative: 0 %
Eosinophils Absolute: 0 10*3/uL (ref 0.0–0.5)
Eosinophils Relative: 0 %
HCT: 52 % — ABNORMAL HIGH (ref 36.0–46.0)
Hemoglobin: 17.1 g/dL — ABNORMAL HIGH (ref 12.0–15.0)
Immature Granulocytes: 0 %
Lymphocytes Relative: 5 %
Lymphs Abs: 0.6 10*3/uL — ABNORMAL LOW (ref 0.7–4.0)
MCH: 29.8 pg (ref 26.0–34.0)
MCHC: 32.9 g/dL (ref 30.0–36.0)
MCV: 90.8 fL (ref 80.0–100.0)
Monocytes Absolute: 0.6 10*3/uL (ref 0.1–1.0)
Monocytes Relative: 5 %
Neutro Abs: 11.9 10*3/uL — ABNORMAL HIGH (ref 1.7–7.7)
Neutrophils Relative %: 90 %
Platelets: 342 10*3/uL (ref 150–400)
RBC: 5.73 MIL/uL — ABNORMAL HIGH (ref 3.87–5.11)
RDW: 12.3 % (ref 11.5–15.5)
WBC: 13.2 10*3/uL — ABNORMAL HIGH (ref 4.0–10.5)
nRBC: 0 % (ref 0.0–0.2)

## 2019-08-30 LAB — URINALYSIS, ROUTINE W REFLEX MICROSCOPIC
Bilirubin Urine: NEGATIVE
Glucose, UA: NEGATIVE mg/dL
Ketones, ur: 20 mg/dL — AB
Nitrite: NEGATIVE
Protein, ur: NEGATIVE mg/dL
Specific Gravity, Urine: 1.02 (ref 1.005–1.030)
pH: 5 (ref 5.0–8.0)

## 2019-08-30 LAB — RESPIRATORY PANEL BY RT PCR (FLU A&B, COVID)
Influenza A by PCR: NEGATIVE
Influenza B by PCR: NEGATIVE
SARS Coronavirus 2 by RT PCR: NEGATIVE

## 2019-08-30 LAB — COMPREHENSIVE METABOLIC PANEL
ALT: 21 U/L (ref 0–44)
AST: 25 U/L (ref 15–41)
Albumin: 4.6 g/dL (ref 3.5–5.0)
Alkaline Phosphatase: 87 U/L (ref 38–126)
Anion gap: 13 (ref 5–15)
BUN: 33 mg/dL — ABNORMAL HIGH (ref 8–23)
CO2: 25 mmol/L (ref 22–32)
Calcium: 9.9 mg/dL (ref 8.9–10.3)
Chloride: 102 mmol/L (ref 98–111)
Creatinine, Ser: 0.94 mg/dL (ref 0.44–1.00)
GFR calc Af Amer: >60 mL/min (ref >60)
GFR calc non Af Amer: 55 mL/min — ABNORMAL LOW (ref >60)
Glucose, Bld: 123 mg/dL — ABNORMAL HIGH (ref 70–99)
Potassium: 4.1 mmol/L (ref 3.5–5.1)
Sodium: 140 mmol/L (ref 135–145)
Total Bilirubin: 0.9 mg/dL (ref 0.3–1.2)
Total Protein: 8 g/dL (ref 6.5–8.1)

## 2019-08-30 LAB — TROPONIN I (HIGH SENSITIVITY): Troponin I (High Sensitivity): 6 ng/L (ref <18)

## 2019-08-30 MED ORDER — SODIUM CHLORIDE 0.9 % IV BOLUS
1000.0000 mL | Freq: Once | INTRAVENOUS | Status: AC
  Administered 2019-08-31: 1000 mL via INTRAVENOUS

## 2019-08-30 MED ORDER — SODIUM CHLORIDE 0.9 % IV BOLUS
500.0000 mL | Freq: Once | INTRAVENOUS | Status: AC
  Administered 2019-08-31: 06:00:00 500 mL via INTRAVENOUS

## 2019-08-30 MED ORDER — ENALAPRILAT 1.25 MG/ML IV SOLN
1.2500 mg | Freq: Four times a day (QID) | INTRAVENOUS | Status: DC | PRN
  Filled 2019-08-30: qty 1

## 2019-08-30 MED ORDER — ATORVASTATIN CALCIUM 80 MG PO TABS
80.0000 mg | ORAL_TABLET | Freq: Every day | ORAL | Status: DC
  Administered 2019-08-31: 18:00:00 80 mg via ORAL
  Filled 2019-08-30 (×2): qty 1

## 2019-08-30 MED ORDER — SODIUM CHLORIDE 0.9 % IV SOLN: Freq: Once | INTRAVENOUS | Status: AC

## 2019-08-30 MED ORDER — SODIUM CHLORIDE (PF) 0.9 % IJ SOLN
INTRAMUSCULAR | Status: AC
  Filled 2019-08-30: qty 50

## 2019-08-30 MED ORDER — ASPIRIN 300 MG RE SUPP
300.0000 mg | Freq: Every day | RECTAL | Status: DC
  Administered 2019-08-30 – 2019-08-31 (×2): 300 mg via RECTAL
  Filled 2019-08-30 (×2): qty 1

## 2019-08-30 MED ORDER — IOHEXOL 350 MG/ML SOLN
100.0000 mL | Freq: Once | INTRAVENOUS | Status: AC | PRN
  Administered 2019-08-30: 100 mL via INTRAVENOUS

## 2019-08-30 MED ORDER — ASPIRIN 325 MG PO TABS: 325.0000 mg | ORAL_TABLET | Freq: Every day | ORAL | Status: DC

## 2019-08-30 MED ORDER — LACTATED RINGERS IV SOLN: INTRAVENOUS | Status: AC

## 2019-08-30 NOTE — ED Triage Notes (Signed)
Pt came from [facility].  Per EMS patient would not respond to staff or open her eyes.  As soon as they left facility she opened her eyes and spoke a little.  She has dementia and evidently is declining .

## 2019-08-30 NOTE — ED Notes (Signed)
Per MD, NS initiated at 278ml/hr. After completion of 1029ml, LR to be started at 145ml/hr. Mechele Claude RN made aware.

## 2019-08-30 NOTE — ED Provider Notes (Signed)
[facility] DEPT Provider Note   CSN: [id number] Arrival date & time: 08/30/19  1344     History Chief Complaint  Patient presents with  . Medical Clearance    Rachel Mcguire is a 86 y.o. female.  Patient is an 86 year old female with past medical history of hypertension, hyperlipidemia, dementia, peripheral artery disease status post CEA, and status post hip fracture.  She presents today for evaluation of altered mental status.  Patient currently resides in [facility] memory care center.  Her son found her today to be altered.  She is not responding or communicating the way that she normally does.  He last saw her 2 days ago and noticed a big change in her mental status, and this has only worsened since.  The son has only seen her a limited number of times over the past year secondary to Covid restrictions, but states that she is significantly changed.  He tells me that she is not responding to his voice and is making no attempt to communicate.  He also believes that he has noticed a left-sided facial droop.  The history is provided by the patient.       Past Medical History:  Diagnosis Date  . Arthritis    both hands  . Cancer ([place]) SKIN CANCERS REMOVED FROM FACE LEG KNEE      BCC/SCC  nose skin cancer  . Carotid artery occlusion   . Hyperglycemia   . Hyperlipidemia   . Hypertension   . Osteoporosis   . Overactive bladder   . Peripheral vascular disease ([place])   . Uterine fibroid     Patient Active Problem List   Diagnosis Date Noted  . Closed left hip fracture, initial encounter ([place]) 07/14/2016  . Essential hypertension 07/14/2016  . Closed fracture of neck of left femur ([place])   . Aftercare following surgery of the circulatory system, [place] 11/20/2013  . Occlusion and stenosis of carotid artery without mention of cerebral infarction 03/11/2011    Past Surgical History:  Procedure Laterality Date  . CAROTID ENDARTERECTOMY  04/12/11   Left CEA  . COLONOSCOPY  2001   mild diverticulosis  . DIAGNOSTIC LAPAROSCOPY     for ovarian pain  . ENDARTERECTOMY  04/12/2011   Procedure: ENDARTERECTOMY CAROTID;  Surgeon: Rosetta Posner, MD;  Location: [facility];  Service: Vascular;  Laterality: Left;  . HIP ARTHROPLASTY Left 07/15/2016   Procedure: ARTHROPLASTY BIPOLAR HIP (HEMIARTHROPLASTY);  Surgeon: Nicholes Stairs, MD;  Location: [facility];  Service: Orthopedics;  Laterality: Left;     OB History   No obstetric history on file.     Family History  Problem Relation Age of Onset  . Heart disease Father        Before age 39  . Stroke Father   . Heart attack Father   . Deep vein thrombosis Father   . Hypertension Father   . Breast cancer Paternal Grandmother   . Hypertension Mother   . Cancer Mother     Social History   Tobacco Use  . Smoking status: Never Smoker  . Smokeless tobacco: Never Used  Substance Use Topics  . Alcohol use: No    Alcohol/week: 0.0 standard drinks  . Drug use: No    Home Medications Prior to Admission medications   Medication Sig Start Date End Date Taking? Authorizing Provider  aspirin EC 325 MG tablet Take 1 tablet (325 mg total) by mouth 2 (two) times daily. 07/17/16  Lacie Draft M, PA-C  atorvastatin (LIPITOR) 10 MG tablet Take 10 mg by mouth daily. 11/01/11   [provider]  donepezil (ARICEPT) 10 MG tablet Take 10 mg by mouth at bedtime.  11/11/15   [provider]  feeding supplement, ENSURE ENLIVE, (ENSURE ENLIVE) LIQD Take 237 mLs by mouth 2 (two) times daily between meals. 07/18/16   Lavina Hamman, MD  hydrochlorothiazide 25 MG tablet Take 25 mg by mouth daily.      [provider]  oxyCODONE (OXY IR/ROXICODONE) 5 MG immediate release tablet Take 1-2 tablets (5-10 mg total) by mouth every 4 (four) hours as needed for breakthrough pain. 07/17/16   Cecilie Kicks, PA-C  polyethylene glycol (MIRALAX / GLYCOLAX) packet Take 17 g by mouth daily. 07/19/16    Lavina Hamman, MD  senna-docusate (SENOKOT-S) 8.6-50 MG tablet Take 1 tablet by mouth at bedtime as needed for mild constipation. 07/18/16   Lavina Hamman, MD    Allergies    Penicillins  Review of Systems   Review of Systems  All other systems reviewed and are negative.   Physical Exam Updated Vital Signs BP (!) 157/73 (BP Location: Left Arm)   Pulse 92   Temp 97.9 F (36.6 C) (Axillary)   Resp 16   SpO2 99%   Physical Exam Vitals and nursing note reviewed.  Constitutional:      General: She is not in acute distress.    Appearance: She is well-developed. She is not diaphoretic.     Comments: Patient is awake, but does not respond to voice or commands, and makes no effort to communicate.  HENT:     Head: Normocephalic and atraumatic.  Eyes:     Pupils: Pupils are equal, round, and reactive to light.  Cardiovascular:     Rate and Rhythm: Normal rate and regular rhythm.     Heart sounds: No murmur. No friction rub. No gallop.   Pulmonary:     Effort: Pulmonary effort is normal. No respiratory distress.     Breath sounds: Normal breath sounds. No wheezing.  Abdominal:     General: Bowel sounds are normal. There is no distension.     Palpations: Abdomen is soft.     Tenderness: There is no abdominal tenderness.  Musculoskeletal:        General: Normal range of motion.     Cervical back: Normal range of motion and neck supple.  Skin:    General: Skin is warm and dry.  Neurological:     Comments: Neurologic exam is somewhat difficult secondary to mental status.  She does move all extremities and there are no obvious focal deficits.  She does not follow commands or attempt to communicate.     ED Results / Procedures / Treatments   Labs (all labs ordered are listed, but only abnormal results are displayed) Labs Reviewed  COMPREHENSIVE METABOLIC PANEL  CBC WITH DIFFERENTIAL/PLATELET  URINALYSIS, ROUTINE W REFLEX MICROSCOPIC  TROPONIN I (HIGH SENSITIVITY)     EKG None  Radiology No results found.  Procedures Procedures (including critical care time)  Medications Ordered in ED Medications  sodium chloride 0.9 % bolus 1,000 mL (has no administration in time range)  sodium chloride 0.9 % bolus 500 mL (has no administration in time range)    ED Course  I have reviewed the triage vital signs and the nursing notes.  Pertinent labs & imaging results that were available during my care of the patient were reviewed  by me and considered in my medical decision making (see chart for details).    MDM Rules/Calculators/A&P  Patient is a an 86 year old female presenting for evaluation of altered mental status.  According to the son, he noticed a big change in her behavior 2 days ago and this became worse this morning.  He notices a left-sided facial droop and she is not speaking as she normally does.  Patient's initial work-up shows no evidence for urinary tract infection or other significant laboratory abnormalities.  She was sent for a head CT which showed white matter changes, but no definitive stroke.  She was then sent for an MRI as there was no other explanation for her altered mental status.  Unfortunately this revealed multiple areas of acute to early subacute ischemia within the right MCA territory indicating possibly multiple events.  These findings were discussed with Dr. Leonel Ramsay from neurology.  It is his belief the patient should be admitted for additional work-up including CTA of the head and neck, telemetry monitoring, as well as echocardiogram.  I have spoken with Dr. Cyd Silence who agrees to make arrangements for transfer to [facility].  CRITICAL CARE Performed by: Veryl Speak Total critical care time: 45 minutes Critical care time was exclusive of separately billable procedures and treating other patients. Critical care was necessary to treat or prevent imminent or life-threatening deterioration. Critical care was time spent  personally by me on the following activities: development of treatment plan with patient and/or surrogate as well as nursing, discussions with consultants, evaluation of patient's response to treatment, examination of patient, obtaining history from patient or surrogate, ordering and performing treatments and interventions, ordering and review of laboratory studies, ordering and review of radiographic studies, pulse oximetry and re-evaluation of patient's condition.   Final Clinical Impression(s) / ED Diagnoses Final diagnoses:  None    Rx / DC Orders ED Discharge Orders    None       Veryl Speak, MD 08/30/19 2115

## 2019-08-30 NOTE — H&P (Signed)
History and Physical    Rachel Mcguire [id number] DOB: Sep 19, 1931 DOA: 08/30/2019  PCP: Lajean Manes, MD  Patient coming from: [facility]   Chief Complaint:   Lethargy/confusion  HPI:  86 year old female with past medical history of carotid stenosis status post left endarterectomy, hyperlipidemia, hypertension, dementia who presents from [facility] skilled nursing facility with a several day history of progressively worsening lethargy, confusion and poor oral intake.  Patient is an extremely poor historian secondary to profound lethargy and the majority of the history is been obtained from the son who is at the bedside.  According to son, patient has been exhibiting progressively worsening weakness lethargy and confusion for the past several days.  Is been associated with poor oral intake.  The son also states that he observes a new onset left facial droop but states that the patient has been moving all 4 extremities spontaneously.  There has been no recent evidence of cough, shortness of breath, fever, diarrhea or complaints of abdominal pain.  Patient symptoms continue to progress into the patient was brought into [facility] emergency department for evaluation.  Upon evaluation in the emergency department MRI of the brain was performed revealing multiple areas of varying signal intensity in the right MCA distribution concerning for multiple events with concern for possible thrombus in the proximal M2 branch of the middle cerebral artery.  Case was discussed with Dr. Leonel Ramsay with neurology who recommended admission to [facility] for neurologic evaluation.  He also recommended CT angiogram of the head and neck.  Hospitalist group was then called to assess the patient for admission the hospital.   Review of Systems: A 10-system review of systems has been performed and all systems are negative with the exception of what is listed in the HPI.    Past Medical  History:  Diagnosis Date  . Arthritis    both hands  . Cancer ([place]) SKIN CANCERS REMOVED FROM FACE LEG KNEE      BCC/SCC  nose skin cancer  . Carotid artery occlusion    S/P left cardotid endarterectomy  . Hyperglycemia   . Hyperlipidemia   . Hypertension   . Osteoporosis   . Overactive bladder   . Peripheral vascular disease ([place])   . Uterine fibroid     Past Surgical History:  Procedure Laterality Date  . CAROTID ENDARTERECTOMY  04/12/11   Left CEA  . COLONOSCOPY  2001   mild diverticulosis  . DIAGNOSTIC LAPAROSCOPY     for ovarian pain  . ENDARTERECTOMY  04/12/2011   Procedure: ENDARTERECTOMY CAROTID;  Surgeon: Rosetta Posner, MD;  Location: [place];  Service: Vascular;  Laterality: Left;  . HIP ARTHROPLASTY Left 07/15/2016   Procedure: ARTHROPLASTY BIPOLAR HIP (HEMIARTHROPLASTY);  Surgeon: Nicholes Stairs, MD;  Location: [place];  Service: Orthopedics;  Laterality: Left;     reports that she has never smoked. She has never used smokeless tobacco. She reports that she does not drink alcohol or use drugs.  Allergies  Allergen Reactions  . Penicillins Itching and Rash    Has patient had a PCN reaction causing immediate rash, facial/tongue/throat swelling, SOB or lightheadedness with hypotension: unknown Has patient had a PCN reaction causing severe rash involving mucus membranes or skin necrosis: unknown Has patient had a PCN reaction that required hospitalization: unknown Has patient had a PCN reaction occurring within the last 10 years: no If all of the above answers are "NO", then may proceed with Cephalosporin use.  Family History  Problem Relation Age of Onset  . Heart disease Father        Before age 78  . Stroke Father   . Heart attack Father   . Deep vein thrombosis Father   . Hypertension Father   . Breast cancer Paternal Grandmother   . Hypertension Mother   . Cancer Mother      Prior to Admission medications   Medication Sig Start Date End Date  Taking? Authorizing Provider  acetaminophen (TYLENOL) 325 MG tablet Take 650 mg by mouth in the morning, at noon, and at bedtime.   Yes [provider]  amLODipine (NORVASC) 5 MG tablet Take 5 mg by mouth daily. 08/24/19  Yes [provider]  donepezil (ARICEPT) 10 MG tablet Take 10 mg by mouth at bedtime.  11/11/15  Yes [provider]  GERI-TUSSIN 100 MG/5ML liquid Take 10 mLs by mouth every 4 (four) hours as needed for cough or congestion.  08/27/19  Yes [provider]  oxyCODONE (OXY IR/ROXICODONE) 5 MG immediate release tablet Take 1-2 tablets (5-10 mg total) by mouth every 4 (four) hours as needed for breakthrough pain. 07/17/16  Yes Lacie Draft M, PA-C  polyethylene glycol (MIRALAX / GLYCOLAX) packet Take 17 g by mouth daily. 07/19/16  Yes Lavina Hamman, MD  senna-docusate (SENOKOT-S) 8.6-50 MG tablet Take 1 tablet by mouth at bedtime as needed for mild constipation. Patient taking differently: Take 1 tablet by mouth daily.  07/18/16  Yes Lavina Hamman, MD  Vitamin D, Ergocalciferol, (DRISDOL) 1.25 MG (50000 UNIT) CAPS capsule Take 50,000 Units by mouth once a week. 08/13/19  Yes [provider]  ACETAMINOPHEN EXTRA STRENGTH 500 MG tablet Take 500 mg by mouth 3 (three) times daily. 05/28/19   [provider]  aspirin EC 325 MG tablet Take 1 tablet (325 mg total) by mouth 2 (two) times daily. Patient not taking: Reported on 08/30/2019 07/17/16   Cecilie Kicks, PA-C  feeding supplement, ENSURE ENLIVE, (ENSURE ENLIVE) LIQD Take 237 mLs by mouth 2 (two) times daily between meals. Patient not taking: Reported on 08/30/2019 07/18/16   Lavina Hamman, MD    Physical Exam: Vitals:   08/30/19 1357 08/30/19 2100 08/30/19 2127  BP: (!) 157/73 (!) 209/99 140/90  Pulse: 92 80   Resp: 16 18   Temp: 97.9 F (36.6 C) 98.7 F (37.1 C)   TempSrc: Axillary Oral   SpO2: 99% 98%     Constitutional: Extremely lethargic and minimally arousable,  patient is nonverbal. Skin: no rashes, no lesions, poor skin turgor noted. Eyes: Pupils are equally reactive to light.  No evidence of scleral icterus or conjunctival pallor.  ENMT: Dry mucous membranes noted.  Posterior pharynx clear of any exudate or lesions poor dentition. Neck: normal, supple, no masses, no thyromegaly Respiratory: clear to auscultation bilaterally, no wheezing, no crackles. Normal respiratory effort. No accessory muscle use.  Cardiovascular: Regular rate and rhythm, no murmurs / rubs / gallops. No extremity edema. 2+ pedal pulses. No carotid bruits.  Back:   Nontender without crepitus or deformity. Abdomen: Abdomen is soft and nontender.  No evidence of intra-abdominal masses.  Positive bowel sounds noted in all quadrants.   Musculoskeletal: No joint deformity upper and lower extremities. Good ROM, no contractures. Normal muscle tone.  Neurologic: Observable left facial droop.  Patient is moving all 4 extremities spontaneously and is not following commands.  Patient is extremely lethargic and minimally responsive to verbal and painful stimuli.  Sensation does seem to be intact however.   Psychiatric: Unable to assess due to profound lethargy.   Labs on Admission: I have personally reviewed following labs and imaging studies -   CBC: Recent Labs  Lab 08/30/19 1600  WBC 13.2*  NEUTROABS 11.9*  HGB 17.1*  HCT 52.0*  MCV 90.8  PLT [id number]   Basic Metabolic Panel: Recent Labs  Lab 08/30/19 1600  NA 140  K 4.1  CL 102  CO2 25  GLUCOSE 123*  BUN 33*  CREATININE 0.94  CALCIUM 9.9   GFR: CrCl cannot be calculated (Unknown ideal weight.). Liver Function Tests: Recent Labs  Lab 08/30/19 1600  AST 25  ALT 21  ALKPHOS 87  BILITOT 0.9  PROT 8.0  ALBUMIN 4.6   No results for input(s): LIPASE, AMYLASE in the last 168 hours. No results for input(s): AMMONIA in the last 168 hours. Coagulation Profile: No results for input(s): INR, PROTIME in the last 168  hours. Cardiac Enzymes: No results for input(s): CKTOTAL, CKMB, CKMBINDEX, TROPONINI in the last 168 hours. BNP (last 3 results) No results for input(s): PROBNP in the last 8760 hours. HbA1C: No results for input(s): HGBA1C in the last 72 hours. CBG: No results for input(s): GLUCAP in the last 168 hours. Lipid Profile: No results for input(s): CHOL, HDL, LDLCALC, TRIG, CHOLHDL, LDLDIRECT in the last 72 hours. Thyroid Function Tests: No results for input(s): TSH, T4TOTAL, FREET4, T3FREE, THYROIDAB in the last 72 hours. Anemia Panel: No results for input(s): VITAMINB12, FOLATE, FERRITIN, TIBC, IRON, RETICCTPCT in the last 72 hours. Urine analysis:    Component Value Date/Time   COLORURINE YELLOW 08/30/2019 1717   APPEARANCEUR HAZY (A) 08/30/2019 1717   LABSPEC 1.020 08/30/2019 1717   PHURINE 5.0 08/30/2019 1717   GLUCOSEU NEGATIVE 08/30/2019 1717   HGBUR MODERATE (A) 08/30/2019 1717   BILIRUBINUR NEGATIVE 08/30/2019 1717   KETONESUR 20 (A) 08/30/2019 1717   PROTEINUR NEGATIVE 08/30/2019 1717   UROBILINOGEN 0.2 04/05/2011 1026   NITRITE NEGATIVE 08/30/2019 1717   LEUKOCYTESUR SMALL (A) 08/30/2019 1717    Radiological Exams on Admission personally reviewed: DG Chest 1 View  Result Date: 08/30/2019 CLINICAL DATA:  Weakness, altered mental status EXAM: CHEST  1 VIEW COMPARISON:  07/14/2016 FINDINGS: The heart size and mediastinal contours are within normal limits. Atherosclerotic calcification of the aortic knob. Focal density in the right hilar region which may reflect overlying vascular structures. Enlarged hilar lymph node, airspace opacity, or pulmonary nodule at this site is not excluded. Left lung appears clear. No pleural effusion or pneumothorax. Degenerative changes of the spine and right shoulder. IMPRESSION: Focal density in the right hilar region which may reflect overlying vascular structures. Enlarged hilar lymph node, airspace opacity, or pulmonary nodule at this site is  not excluded. Contrast enhanced CT of the chest could be performed for further evaluation. Electronically Signed   By: Davina Poke D.O.   On: 08/30/2019 16:00   CT Head Wo Contrast  Result Date: 08/30/2019 CLINICAL DATA:  Altered mental status EXAM: CT HEAD WITHOUT CONTRAST TECHNIQUE: Contiguous axial images were obtained from the base of the skull through the vertex without intravenous contrast. COMPARISON:  MRI 08/05/2015 FINDINGS: Brain: No acute territorial infarction, hemorrhage, or intracranial mass is visualized. Mild atrophy. Stable ventricular enlargement somewhat out of proportion to atrophy. Extensive hypodensity within the white matter consistent with chronic small vessel ischemic change. More focal hypodensity within the right subcortical white matter posteriorly may reflect small vessel ischemic change or  age indeterminate white matter infarct. Chronic lacunar infarct in the right thalamus. Vascular: No hyperdense vessels. Vertebral and carotid vascular calcification Skull: Normal. Negative for fracture or focal lesion. Sinuses/Orbits: No acute finding. Other: None IMPRESSION: 1. Extensive chronic small vessel ischemic change of the white matter. More focal hypodensity within the right posterior subcortical white matter may reflect additional change of small-vessel ischemia however age indeterminate white matter infarct cannot be excluded. 2. Atrophy. Generalized ventricular enlargement somewhat out of proportion to atrophy, possible normal pressure hydrocephalus. Electronically Signed   By: Donavan Foil M.D.   On: 08/30/2019 17:22   MR BRAIN WO CONTRAST  Result Date: 08/30/2019 CLINICAL DATA:  Worsening confusion. EXAM: MRI HEAD WITHOUT CONTRAST TECHNIQUE: Multiplanar, multiecho pulse sequences of the brain and surrounding structures were obtained without intravenous contrast. COMPARISON:  Head CT 08/30/2019 FINDINGS: Brain: There are areas of abnormal diffusion restriction within the  right MCA territory, predominantly within the posterior frontal lobe and right parietal lobe. No contralateral ischemia or ischemia and other vascular distribution. Variance in the intensity of the DWI findings may indicate multiple events. Early confluent hyperintense T2-weighted signal of the periventricular and deep white matter, most commonly due to chronic ischemic microangiopathy. There is generalized atrophy without lobar predilection. No chronic microhemorrhage. Normal midline structures. Vascular: Focus of magnetic susceptibility effect within the proximal M2 segment of the right middle cerebral artery (series 11, image 26). Skull and upper cervical spine: Normal marrow signal. Sinuses/Orbits: Negative. Other: None. IMPRESSION: 1. Multiple areas of acute to early subacute ischemia within the right MCA territory. Varying signal intensity may indicate multiple events. 2. Focus of magnetic susceptibility effect within the proximal M2 segment of the right middle cerebral artery, possibly indicating the site of thrombus. 3. Findings of chronic ischemic microangiopathy and generalized atrophy. Electronically Signed   By: Ulyses Jarred M.D.   On: 08/30/2019 19:31    EKG: Pending  Assessment/Plan Stroke due to occlusion of the right middle cerebral artery   Patient presenting with several day history of progressively worsening lethargy and poor oral intake.  Additionally patient is exhibiting left facial droop on arrival.  Patient is a delay in presentation was likely due to patient's known history of advanced dementia and poor baseline making bedside assessments difficult at the skilled nursing facility  MRI brain without contrast upon arrival in the emergency department reveals evidence of a right MCA stroke with multiple areas of involvement in addition to an area of possible thrombus in the proximal M2 segment of the middle cerebral artery.  Considering patient's known history of carotid artery  stenosis, an embolic stroke is possible  CT angiogram of the head neck has been ordered to reevaluate carotid artery disease  Echocardiogram has additionally been ordered and may be transitioned to a TEE if the CT angiogram of the head neck reveals no evidence of source  We will proceed with administration of aspirin and statin therapy after bedside swallow screen is performed  Physical therapy and speech therapy consultations are being ordered  Patient is already been discussed with Dr. Leonel Ramsay with neurology at [facility] who recommends admission directly to [facility] for further neurologic evaluation and work-up.  Active Problems:   Hypertensive urgency  Permissive hypertension due to acute stroke  Only provide as needed intravenous intravenous for markedly elevated blood pressures     Acute metabolic encephalopathy   I believe patient is also suffering from superimposed acute metabolic encephalopathy secondary to severe dehydration considering how hemoconcentrated patient CBC  is  Hydrating patient with intravenous isotonic fluid  Hopefully superimposed cephalopathy resolved with intravenous hydration.  There is also possibly a superimposed infectious process considering abnormality on chest x-ray which would also potentially cause an encephalopathy-obtaining CT chest per radiology recommendations    Dehydration   Hydrating with intravenous isotonic fluids, see remainder of assessment plan above.    Dementia without behavioral disturbance (HCC)   Known history of substantial dementia confounds patient's clinical assessment    Leukocytosis   Patient exhibiting leukocytosis  Infectious source is not entirely clear, chest x-ray reveals a possible focal infiltrate for which we are obtaining CT imaging of the chest perRadiology recommendation  Additionally obtaining urinalysis  If either urinalysis or CT chest reveal source of infection will initiate intravenous  antibiotics.   Code Status:  DNR -son is surrogate decision-maker Family Communication: Discussed with son who is at the bedside  Status is: Inpatient  Remains inpatient appropriate because:Altered mental status, Ongoing diagnostic testing needed not appropriate for outpatient work up, Unsafe d/c plan, IV treatments appropriate due to intensity of illness or inability to take PO and Inpatient level of care appropriate due to severity of illness   Dispo: The patient is from: SNF              Anticipated d/c is to: SNF              Anticipated d/c date is: > 3 days              Patient currently is not medically stable to d/c.         Vernelle Emerald MD [facility] Pager [phone]  If 7PM-7AM, please contact night-coverage [email or web address] Use universal [place] password for that web site. If you do not have the password, please call the hospital operator.  08/30/2019, 9:49 PM

## 2019-08-31 ENCOUNTER — Inpatient Hospital Stay (HOSPITAL_COMMUNITY): Payer: Medicare PPO

## 2019-08-31 DIAGNOSIS — E860 Dehydration: Secondary | ICD-10-CM

## 2019-08-31 DIAGNOSIS — I361 Nonrheumatic tricuspid (valve) insufficiency: Secondary | ICD-10-CM

## 2019-08-31 DIAGNOSIS — I639 Cerebral infarction, unspecified: Secondary | ICD-10-CM | POA: Diagnosis present

## 2019-08-31 DIAGNOSIS — I160 Hypertensive urgency: Secondary | ICD-10-CM

## 2019-08-31 DIAGNOSIS — F0390 Unspecified dementia without behavioral disturbance: Secondary | ICD-10-CM

## 2019-08-31 DIAGNOSIS — D72829 Elevated white blood cell count, unspecified: Secondary | ICD-10-CM

## 2019-08-31 DIAGNOSIS — I351 Nonrheumatic aortic (valve) insufficiency: Secondary | ICD-10-CM

## 2019-08-31 LAB — URINALYSIS, COMPLETE (UACMP) WITH MICROSCOPIC
Bacteria, UA: NONE SEEN
Bilirubin Urine: NEGATIVE
Glucose, UA: NEGATIVE mg/dL
Ketones, ur: 5 mg/dL — AB
Nitrite: NEGATIVE
Protein, ur: 30 mg/dL — AB
Specific Gravity, Urine: >1.046 — ABNORMAL HIGH (ref 1.005–1.030)
pH: 7 (ref 5.0–8.0)

## 2019-08-31 LAB — LIPID PANEL
Cholesterol: 178 mg/dL (ref 0–200)
HDL: 45 mg/dL (ref >40)
LDL Cholesterol: 114 mg/dL — ABNORMAL HIGH (ref 0–99)
Total CHOL/HDL Ratio: 4 RATIO
Triglycerides: 96 mg/dL (ref <150)
VLDL: 19 mg/dL (ref 0–40)

## 2019-08-31 LAB — CBC WITH DIFFERENTIAL/PLATELET
Abs Immature Granulocytes: 0.02 10*3/uL (ref 0.00–0.07)
Basophils Absolute: 0 10*3/uL (ref 0.0–0.1)
Basophils Relative: 0 %
Eosinophils Absolute: 0.1 10*3/uL (ref 0.0–0.5)
Eosinophils Relative: 1 %
HCT: 42.9 % (ref 36.0–46.0)
Hemoglobin: 14 g/dL (ref 12.0–15.0)
Immature Granulocytes: 0 %
Lymphocytes Relative: 17 %
Lymphs Abs: 1.2 10*3/uL (ref 0.7–4.0)
MCH: 30 pg (ref 26.0–34.0)
MCHC: 32.6 g/dL (ref 30.0–36.0)
MCV: 92.1 fL (ref 80.0–100.0)
Monocytes Absolute: 0.4 10*3/uL (ref 0.1–1.0)
Monocytes Relative: 6 %
Neutro Abs: 5.4 10*3/uL (ref 1.7–7.7)
Neutrophils Relative %: 76 %
Platelets: 272 10*3/uL (ref 150–400)
RBC: 4.66 MIL/uL (ref 3.87–5.11)
RDW: 12.4 % (ref 11.5–15.5)
WBC: 7.1 10*3/uL (ref 4.0–10.5)
nRBC: 0 % (ref 0.0–0.2)

## 2019-08-31 LAB — HEMOGLOBIN A1C
Hgb A1c MFr Bld: 5.8 % — ABNORMAL HIGH (ref 4.8–5.6)
Mean Plasma Glucose: 119.76 mg/dL

## 2019-08-31 LAB — BASIC METABOLIC PANEL
Anion gap: 9 (ref 5–15)
BUN: 17 mg/dL (ref 8–23)
CO2: 22 mmol/L (ref 22–32)
Calcium: 8.4 mg/dL — ABNORMAL LOW (ref 8.9–10.3)
Chloride: 107 mmol/L (ref 98–111)
Creatinine, Ser: 0.65 mg/dL (ref 0.44–1.00)
GFR calc Af Amer: >60 mL/min (ref >60)
GFR calc non Af Amer: >60 mL/min (ref >60)
Glucose, Bld: 100 mg/dL — ABNORMAL HIGH (ref 70–99)
Potassium: 3.5 mmol/L (ref 3.5–5.1)
Sodium: 138 mmol/L (ref 135–145)

## 2019-08-31 LAB — ECHOCARDIOGRAM COMPLETE

## 2019-08-31 MED ORDER — CLOPIDOGREL BISULFATE 75 MG PO TABS
75.0000 mg | ORAL_TABLET | Freq: Every day | ORAL | Status: DC
  Administered 2019-09-01 – 2019-09-03 (×3): 75 mg via ORAL
  Filled 2019-08-31 (×3): qty 1

## 2019-08-31 MED ORDER — SODIUM CHLORIDE 0.9 % IV SOLN
1.0000 g | INTRAVENOUS | Status: DC
  Administered 2019-08-31 – 2019-09-02 (×3): 1 g via INTRAVENOUS
  Filled 2019-08-31 (×3): qty 10

## 2019-08-31 MED ORDER — ACETAMINOPHEN 650 MG RE SUPP: 650.0000 mg | RECTAL | Status: DC | PRN

## 2019-08-31 MED ORDER — ACETAMINOPHEN 325 MG PO TABS: 650.0000 mg | ORAL_TABLET | ORAL | Status: DC | PRN

## 2019-08-31 MED ORDER — ENOXAPARIN SODIUM 40 MG/0.4ML ~~LOC~~ SOLN
40.0000 mg | SUBCUTANEOUS | Status: DC
  Administered 2019-08-31 – 2019-09-02 (×3): 40 mg via SUBCUTANEOUS
  Filled 2019-08-31 (×3): qty 0.4

## 2019-08-31 MED ORDER — ATORVASTATIN CALCIUM 40 MG PO TABS
40.0000 mg | ORAL_TABLET | Freq: Every day | ORAL | Status: DC
  Administered 2019-09-01 – 2019-09-03 (×3): 40 mg via ORAL
  Filled 2019-08-31 (×3): qty 1

## 2019-08-31 MED ORDER — ACETAMINOPHEN 160 MG/5ML PO SOLN: 650.0000 mg | ORAL | Status: DC | PRN

## 2019-08-31 MED ORDER — STROKE: EARLY STAGES OF RECOVERY BOOK
Freq: Once | Status: AC
  Filled 2019-08-31 (×2): qty 1

## 2019-08-31 MED ORDER — ASPIRIN EC 81 MG PO TBEC
81.0000 mg | DELAYED_RELEASE_TABLET | Freq: Every day | ORAL | Status: DC
  Administered 2019-09-01: 81 mg via ORAL
  Filled 2019-08-31: qty 1

## 2019-08-31 MED ORDER — SENNOSIDES-DOCUSATE SODIUM 8.6-50 MG PO TABS: 1.0000 | ORAL_TABLET | Freq: Every evening | ORAL | Status: DC | PRN

## 2019-08-31 MED ORDER — RESOURCE THICKENUP CLEAR PO POWD
ORAL | Status: DC | PRN
  Filled 2019-08-31: qty 125

## 2019-08-31 NOTE — ED Notes (Signed)
Pt had medium, loose BM. Pt cleaned, pericare performed. Pt repositioned on [employer]. Pt transported to [facility] 3W-14 via [place].Pt stable at transport

## 2019-08-31 NOTE — ED Notes (Signed)
Pt lying in bed, eye's closed, chest rising and falling. Family at bedside. Will continue to monitor.

## 2019-08-31 NOTE — ED Notes (Signed)
Speech at bedside

## 2019-08-31 NOTE — ED Notes (Signed)
Pt resting in bed comfortably. NAD noted. Pts son remains at bedside. Pt and family updated on plan of care and reasons for delay

## 2019-08-31 NOTE — ED Notes (Signed)
Unable to complete NIHSS due to pts inability to follow commands. Pt has been non verbal with staff. Pt drowsy. Pt arousable to voice

## 2019-08-31 NOTE — Evaluation (Signed)
Clinical/Bedside Swallow Evaluation Patient Details  Name: Rachel Mcguire MRN: [id number] Date of Birth: 12/11/1931  Today's Date: 08/31/2019 Time: SLP Start Time (ACUTE ONLY): 1110 SLP Stop Time (ACUTE ONLY): 1140 SLP Time Calculation (min) (ACUTE ONLY): 30 min  Past Medical History:  Past Medical History:  Diagnosis Date  . Arthritis    both hands  . Cancer ([place]) SKIN CANCERS REMOVED FROM FACE LEG KNEE      BCC/SCC  nose skin cancer  . Carotid artery occlusion    S/P left cardotid endarterectomy  . Hyperglycemia   . Hyperlipidemia   . Hypertension   . Osteoporosis   . Overactive bladder   . Peripheral vascular disease ([place])   . Uterine fibroid    Past Surgical History:  Past Surgical History:  Procedure Laterality Date  . CAROTID ENDARTERECTOMY  04/12/11   Left CEA  . COLONOSCOPY  2001   mild diverticulosis  . DIAGNOSTIC LAPAROSCOPY     for ovarian pain  . ENDARTERECTOMY  04/12/2011   Procedure: ENDARTERECTOMY CAROTID;  Surgeon: Rosetta Posner, MD;  Location: [place];  Service: Vascular;  Laterality: Left;  . HIP ARTHROPLASTY Left 07/15/2016   Procedure: ARTHROPLASTY BIPOLAR HIP (HEMIARTHROPLASTY);  Surgeon: Nicholes Stairs, MD;  Location: [place];  Service: Orthopedics;  Laterality: Left;   HPI:  86 year old female with past medical history of carotid stenosis status post left endarterectomy, hyperlipidemia, hypertension, dementia who presents from [facility] skilled nursing facility with a several day history of progressively worsening lethargy, confusion and poor oral intake. MRI = acute-early subacute ischemia Right MCA territory. ChestCT = mild BLL atelectasis   Assessment / Plan / Recommendation Clinical Impression  Pt seen at bedside to assess readiness to begin po diet. Oral care was completed with suction. Pt noted to have adequate dentition. Oral cavity moist, pink, and healthy. Following oral care pt accepted trials of ice chips, thin liquid, nectar thick  liquid, puree, and solid textures. Cough response noted after thin liquid trials, raising concern for airway compromise. Ice chips, nectar thick liquid, and puree trials were tolerated well without overt s/s aspiration or obvious oral deficits. Pt appeared to have difficulty with oral manipulation of cracker. Oral residue noted and removed with suction. Pt is at increased risk for aspiration, given inability to follow commands and dx dementia.   Recommend conservative diet of puree and nectar thick liquids, meds crushed in puree, to be given only when pt is fully awake and alert and seated upright. Check oral cavity for clearing throughout meals.   SLE deferred at this time. Pt is currently nonverbal, and does not follow commands, even with max mutimodal cues. No family present to confirm severity of deficits at baseline. SLP will continue to follow to assess diet tolerance and provide education. RN and MD informed. Recommend palliative care consult to facilitate establishment of goals of care.   SLP Visit Diagnosis: Dysphagia, unspecified (R13.10)    Aspiration Risk  Moderate aspiration risk;Risk for inadequate nutrition/hydration    Diet Recommendation Dysphagia 1 (Puree);Nectar-thick liquid   Liquid Administration via: Cup;Straw Medication Administration: Crushed with puree Supervision: Patient able to self feed Compensations: Minimize environmental distractions;Slow rate;Small sips/bites Postural Changes: Seated upright at 90 degrees;Remain upright for at least 30 minutes after po intake    Other  Recommendations Oral Care Recommendations: Oral care BID Other Recommendations: Have oral suction available;Order thickener from pharmacy   Follow up Recommendations 24 hour supervision/assistance;Skilled Nursing facility      Frequency and Duration  min 1 x/week  1 week;2 weeks       Prognosis Prognosis for Safe Diet Advancement: Fair Barriers to Reach Goals: Cognitive deficits       Swallow Study   General Date of Onset: 08/30/19 HPI: 87 year old female with past medical history of carotid stenosis status post left endarterectomy, hyperlipidemia, hypertension, dementia who presents from [facility] skilled nursing facility with a several day history of progressively worsening lethargy, confusion and poor oral intake. MRI = acute-early subacute ischemia Right MCA territory. ChestCT = mild BLL atelectasis Type of Study: Bedside Swallow Evaluation Previous Swallow Assessment: none Diet Prior to this Study: NPO Temperature Spikes Noted: No Respiratory Status: Room air History of Recent Intubation: No Behavior/Cognition: Alert;Cooperative;Doesn't follow directions;Requires cueing Oral Cavity Assessment: Within Functional Limits Oral Care Completed by SLP: Yes Oral Cavity - Dentition: Adequate natural dentition Vision: Functional for self-feeding Self-Feeding Abilities: Total assist Patient Positioning: Upright in bed Baseline Vocal Quality: Not observed Volitional Cough: Cognitively unable to elicit Volitional Swallow: Unable to elicit    Oral/Motor/Sensory Function Overall Oral Motor/Sensory Function: (unable to assess)   Ice Chips Ice chips: Within functional limits Presentation: Spoon   Thin Liquid Thin Liquid: Impaired Presentation: Cup;Straw Pharyngeal  Phase Impairments: Cough - Immediate;Suspected delayed Swallow    Nectar Thick Nectar Thick Liquid: Within functional limits Presentation: Cup;Straw   Honey Thick Honey Thick Liquid: Not tested   Puree Puree: Within functional limits Presentation: Spoon   Solid     Solid: Impaired Oral Phase Impairments: Impaired mastication;Poor awareness of bolus Oral Phase Functional Implications: Prolonged oral transit;Impaired mastication;Oral residue     Kaian Fahs B. Quentin Ore, [facility] [place], [place] Speech Language Pathologist Office: [phone] Pager: [phone]  Shonna Chock 08/31/2019,11:58 AM

## 2019-08-31 NOTE — ED Notes (Signed)
Pts son at bedside and updated on plan of care

## 2019-08-31 NOTE — ED Notes (Signed)
Pt placed on " necator thick diet and crushed medications" by speech therapist.

## 2019-08-31 NOTE — ED Notes (Signed)
Pts linens soiled. Pts linens changed at this time. Pericare performed. New purewick placed. Pt repositioned in bed. Pts IV wrapped with gauze due to pt pulling at lines and monitoring equipment. Pt placed in safety mittens due to pt pulling at IV lines.

## 2019-08-31 NOTE — Progress Notes (Signed)
Carotid artery duplex has been completed. Preliminary results can be found in CV Proc through chart review.   08/31/19 12:08 PM Rachel Mcguire RVT

## 2019-08-31 NOTE — Progress Notes (Signed)
  Echocardiogram 2D Echocardiogram has been performed.  Rachel Mcguire 08/31/2019, 3:40 PM

## 2019-08-31 NOTE — ED Notes (Signed)
Pt lying in bed eye's closed, chests rising and falling. Family at bedside. Pt responsive to being moved. Will continue to monitor.

## 2019-08-31 NOTE — Progress Notes (Signed)
Pt arrived on unit.  Initial vital signs are a MEWS of green.

## 2019-08-31 NOTE — ED Notes (Signed)
Pts PRN medication for BP have parameters for a systolic less than [id number]. Pt does not meet parameter for PRN antihypertensive at this time. Will continue to monitor.

## 2019-08-31 NOTE — ED Notes (Signed)
Pt lying in bed, eye's closed, chest rising and falling. Pt repositioned in bed. Family at bedside. Will continue to monitor.

## 2019-08-31 NOTE — Consult Note (Addendum)
Referring Physician: Dr. Marcello Moores    Reason for Consult: Neurologic opinion on stroke   HPI: Rachel Mcguire is an 86 y.o. female with past medical history of carotid stenosis status post left endarterectomy, hyperlipidemia, hypertension, dementia who presents from [facility] skilled nursing facility with a several day history of progressively worsening lethargy, confusion and poor oral intake. MRI was done as part of her admit work up, which showed scattered right MCA infarcts, both subacute/acute. Pt is unable to participate in history or exam, thus it is gained through her son at bedside.   Past Medical History Past Medical History:  Diagnosis Date  . Arthritis    both hands  . Cancer ([place]) SKIN CANCERS REMOVED FROM FACE LEG KNEE      BCC/SCC  nose skin cancer  . Carotid artery occlusion    S/P left cardotid endarterectomy  . Hyperglycemia   . Hyperlipidemia   . Hypertension   . Osteoporosis   . Overactive bladder   . Peripheral vascular disease ([place])   . Uterine fibroid     Surgical History Past Surgical History:  Procedure Laterality Date  . CAROTID ENDARTERECTOMY  04/12/11   Left CEA  . COLONOSCOPY  2001   mild diverticulosis  . DIAGNOSTIC LAPAROSCOPY     for ovarian pain  . ENDARTERECTOMY  04/12/2011   Procedure: ENDARTERECTOMY CAROTID;  Surgeon: Rosetta Posner, MD;  Location: [place];  Service: Vascular;  Laterality: Left;  . HIP ARTHROPLASTY Left 07/15/2016   Procedure: ARTHROPLASTY BIPOLAR HIP (HEMIARTHROPLASTY);  Surgeon: Nicholes Stairs, MD;  Location: [place];  Service: Orthopedics;  Laterality: Left;    Family History  Family History  Problem Relation Age of Onset  . Heart disease Father        Before age 89  . Stroke Father   . Heart attack Father   . Deep vein thrombosis Father   . Hypertension Father   . Breast cancer Paternal Grandmother   . Hypertension Mother   . Cancer Mother     Social History:   reports that she has never smoked. She has  never used smokeless tobacco. She reports that she does not drink alcohol or use drugs.  Allergies:  Allergies  Allergen Reactions  . Penicillins Itching and Rash    Has patient had a PCN reaction causing immediate rash, facial/tongue/throat swelling, SOB or lightheadedness with hypotension: unknown Has patient had a PCN reaction causing severe rash involving mucus membranes or skin necrosis: unknown Has patient had a PCN reaction that required hospitalization: unknown Has patient had a PCN reaction occurring within the last 10 years: no If all of the above answers are "NO", then may proceed with Cephalosporin use.    Home Medications:  Medications Prior to Admission  Medication Sig Dispense Refill  . acetaminophen (TYLENOL) 325 MG tablet Take 650 mg by mouth in the morning, at noon, and at bedtime.    Marland Kitchen amLODipine (NORVASC) 5 MG tablet Take 5 mg by mouth daily.    Marland Kitchen donepezil (ARICEPT) 10 MG tablet Take 10 mg by mouth at bedtime.     Marland Kitchen GERI-TUSSIN 100 MG/5ML liquid Take 10 mLs by mouth every 4 (four) hours as needed for cough or congestion.     Marland Kitchen oxyCODONE (OXY IR/ROXICODONE) 5 MG immediate release tablet Take 1-2 tablets (5-10 mg total) by mouth every 4 (four) hours as needed for breakthrough pain. 50 tablet 0  . polyethylene glycol (MIRALAX / GLYCOLAX) packet Take 17 g by mouth  daily. 14 each 0  . senna-docusate (SENOKOT-S) 8.6-50 MG tablet Take 1 tablet by mouth at bedtime as needed for mild constipation. (Patient taking differently: Take 1 tablet by mouth daily. ) 30 tablet 0  . Vitamin D, Ergocalciferol, (DRISDOL) 1.25 MG (50000 UNIT) CAPS capsule Take 50,000 Units by mouth once a week.    . ACETAMINOPHEN EXTRA STRENGTH 500 MG tablet Take 500 mg by mouth 3 (three) times daily.    Marland Kitchen aspirin EC 325 MG tablet Take 1 tablet (325 mg total) by mouth 2 (two) times daily. (Patient not taking: Reported on 08/30/2019) 60 tablet 0  . feeding supplement, ENSURE ENLIVE, (ENSURE ENLIVE) LIQD Take  237 mLs by mouth 2 (two) times daily between meals. (Patient not taking: Reported on 08/30/2019) 237 mL 12    Hospital Medications .  stroke: mapping our early stages of recovery book   Does not apply Once  . aspirin  300 mg Rectal Daily   Or  . aspirin  325 mg Oral Daily  . atorvastatin  80 mg Oral Daily  . enoxaparin (LOVENOX) injection  40 mg Subcutaneous Q24H    ROS: unable to truly obtain as pt cannot cooperate and son at bedside only just visited her at her memory care facility at time of admit.   Physical Examination:  Vitals:   08/31/19 1215 08/31/19 1300 08/31/19 1431 08/31/19 1630  BP: (!) 173/74 (!) 192/82 (!) 159/93 (!) 158/92  Pulse: 60 (!) 58 60 70  Resp: 18 12 14 14   Temp:   98.2 F (36.8 C) 98.1 F (36.7 C)  TempSrc:   Oral Oral  SpO2: 96% 96% 97% 98%   General: Appears well-developed . Psych: Affect appropriate to situation Eyes: No scleral injection HENT: No OP obstrucion Head: Normocephalic.  Cardiovascular: Normal rate and regular rhythm.  Respiratory: Effort normal and breath sounds normal to anterior ascultation GI: Soft.  No distension. There is no tenderness.  Skin: WDI    Neurological Examination Mental Status: Alert, no attempt to speak or follow any commands. Only a blank stare.  Cranial Nerves: II: limited d/t global aphasia. Visual fields grossly normal to blink to threat in all divisions III,IV, VI: ptosis not present, extra-ocular motions intact bilaterally when tracking me in room, but cannot test formally dt lack of cooperation, pupils equal, round, reactive to light and accommodation V,VII: mute expression, flat affect and not even a grimmace w/stimuli. Slight flatening of nasolabial fold on left, unable to test facial touch sensation as she has no cooperation VIII: hearing unknonw IX,X: not able to test XI: not able to test XII: not able to test Motor: Tone and bulk:normal tone throughout; no atrophy noted. Moving all ext in response  to stimuli, but not able to formally test strength. Sensory: Pinprick and light touch intact throughout, bilaterally Deep Tendon Reflexes: 2+ and symmetric throughout Plantars: Right: downgoing   Left: downgoing Cerebellar: Unable to test Gait: did not test NIHSS 1a Level of Conscious.: 0 1b LOC Questions: 2 1c LOC Commands: 2 2 Best Gaze: 0 3 Visual: 0 4 Facial Palsy: 1 5a Motor Arm - Left:0 5b Motor Arm - Right:0  6a Motor Leg - Left: 0 6b Motor Leg - Right:0  7 Limb Ataxia: 0 8 Sensory: 0 9 Best Language:3  10 Dysarthria: 2 11 Extinct. and Inatten.: 1 TOTAL: 11    LABORATORY STUDIES:  Basic Metabolic Panel: Recent Labs  Lab 08/30/19 1600 08/31/19 1036  NA 140 138  K 4.1 3.5  CL 102 107  CO2 25 22  GLUCOSE 123* 100*  BUN 33* 17  CREATININE 0.94 0.65  CALCIUM 9.9 8.4*    Liver Function Tests: Recent Labs  Lab 08/30/19 1600  AST 25  ALT 21  ALKPHOS 87  BILITOT 0.9  PROT 8.0  ALBUMIN 4.6   No results for input(s): LIPASE, AMYLASE in the last 168 hours. No results for input(s): AMMONIA in the last 168 hours.  CBC: Recent Labs  Lab 08/30/19 1600 08/31/19 1036  WBC 13.2* 7.1  NEUTROABS 11.9* 5.4  HGB 17.1* 14.0  HCT 52.0* 42.9  MCV 90.8 92.1  PLT 342 272    Cardiac Enzymes: No results for input(s): CKTOTAL, CKMB, CKMBINDEX, TROPONINI in the last 168 hours.  BNP: Invalid input(s): POCBNP  CBG: No results for input(s): GLUCAP in the last 168 hours.  Microbiology:   Coagulation Studies: No results for input(s): LABPROT, INR in the last 72 hours.  Urinalysis:  Recent Labs  Lab 08/30/19 1717 08/31/19 0045  COLORURINE YELLOW YELLOW  LABSPEC 1.020 >1.046*  PHURINE 5.0 7.0  GLUCOSEU NEGATIVE NEGATIVE  HGBUR MODERATE* MODERATE*  BILIRUBINUR NEGATIVE NEGATIVE  KETONESUR 20* 5*  PROTEINUR NEGATIVE 30*  NITRITE NEGATIVE NEGATIVE  LEUKOCYTESUR SMALL* MODERATE*    Lipid Panel:     Component Value Date/Time   CHOL 178 08/31/2019  0605   TRIG 96 08/31/2019 0605   HDL 45 08/31/2019 0605   CHOLHDL 4.0 08/31/2019 0605   VLDL 19 08/31/2019 0605   LDLCALC 114 (H) 08/31/2019 0605    HgbA1C:  Lab Results  Component Value Date   HGBA1C 5.8 (H) 08/31/2019    Urine Drug Screen:  No results found for: LABOPIA, COCAINSCRNUR, LABBENZ, AMPHETMU, THCU, LABBARB   Alcohol Level:  No results for input(s): ETH in the last 168 hours.  Miscellaneous labs:  EKG  EKG   IMAGING: CT ANGIO HEAD W OR WO CONTRAST  Result Date: 08/30/2019 CLINICAL DATA:  Altered mental status. EXAM: CT HEAD WITHOUT CONTRAST CT ANGIOGRAPHY OF THE HEAD AND NECK TECHNIQUE: Contiguous axial images were obtained from the base of the skull through the vertex without intravenous contrast. Multidetector CT imaging of the head and neck was performed using the standard protocol during bolus administration of intravenous contrast. Multiplanar CT image reconstructions and MIPs were obtained to evaluate the vascular anatomy. Carotid stenosis measurements (when applicable) are obtained utilizing NASCET criteria, using the distal internal carotid diameter as the denominator. CONTRAST:  129mL OMNIPAQUE IOHEXOL 350 MG/ML SOLN COMPARISON:  Plain brain CT, dated August 30, 2018 and MRI head, August 05, 2015 FINDINGS: CT HEAD Brain: There is moderate severity cerebral atrophy with widening of the extra-axial spaces and ventricular dilatation. There are areas of decreased attenuation within the white matter tracts of the supratentorial brain, consistent with microvascular disease changes. Chronic bilateral basal ganglia lacunar infarcts are seen. Vascular: No hyperdense vessel or unexpected calcification. Skull: Normal. Negative for fracture or focal lesion. Sinuses/Orbits: No acute finding. CTA NECK Aortic arch: Moderate severity calcification of the aortic arch with normal origins of the brachiocephalic, left common carotid, and left subclavian arteries. Normal brachiocephalic  artery. Normal origin of the right common carotid artery. Normal left subclavian artery, without a hemodynamically significant stenosis. Right carotid system: Normal right common carotid artery (CCA). There is moderate severity calcification of the right common carotid bulb. There is moderate severity calcification of the origin of the right internal carotid (ICA) artery without a hemodynamically significant stenosis. There is marked severity calcification  the cervical portion of the right internal carotid artery. Normal origin of the right external carotid artery (ECA). Left carotid system: Normal left common carotid artery (CCA). Normal left common carotid bulb. Normal origin of the left internal carotid (ICA) artery without a hemodynamically significant stenosis. Normal visualized cervical portion of the left internal carotid artery. Normal origin of the left external carotid artery (ECA). Vertebral arteries:There is marked severity calcification of the origin of the left vertebral artery and distal aspect of the right vertebral artery. Skeleton: No acute osseous abnormalities. Other neck: N/A CTA HEAD Anterior circulation: Normal bilateral petrous carotid arteries. There is mild to moderate severity calcification of the right and left cavernous carotid arteries with a normal supraclinoid bifurcation. Normal right A1 segments of the anterior cerebral artery. Normal left A1 segments of the anterior cerebral artery. Normal intact anterior communicating artery (ACOM). Normal bilateral A2 segments of the anterior cerebral arteries. Normal right M1 and M2 segments of the middle cerebral arteries, with a normal M1 bifurcation. Normal left M1 and M2 segments of the middle cerebral arteries, with a normal M1 bifurcation. Posterior circulation: Normal right posterior communicating artery (PCOM). Normal left posterior communicating artery (PCOM). Normal bilateral vertebral arteries. Normal basilar artery with a normal  basilar bifurcation. The visualized bilateral superior cerebellar (SCA) arteries are normal. Normal bilateral P1, P2 and visualized P3 segments of the posterior cerebral arteries. There is no demonstrated aneurysm of the circle of Willis. Venous sinuses: Unremarkable. Anatomic variants: Normal. Delayed phase: Unremarkable. IMPRESSION: 1. No acute intracranial abnormality. 2. Cerebral atrophy and microvascular disease changes. 3. Chronic bilateral basal ganglia lacunar infarcts. 4. No hemodynamically significant stenosis involving either carotid system. Marked severity calcification at the origin of the left vertebral artery and distal aspect of the right vertebral artery. 5. Mild to moderate severity calcification of the bilateral cavernous carotid arteries without evidence of intracranial aneurysm or hemodynamically significant stenosis. Aortic Atherosclerosis (ICD10-I70.0). Electronically Signed   By: Virgina Norfolk M.D.   On: 08/30/2019 23:49   DG Chest 1 View  Result Date: 08/30/2019 CLINICAL DATA:  Weakness, altered mental status EXAM: CHEST  1 VIEW COMPARISON:  07/14/2016 FINDINGS: The heart size and mediastinal contours are within normal limits. Atherosclerotic calcification of the aortic knob. Focal density in the right hilar region which may reflect overlying vascular structures. Enlarged hilar lymph node, airspace opacity, or pulmonary nodule at this site is not excluded. Left lung appears clear. No pleural effusion or pneumothorax. Degenerative changes of the spine and right shoulder. IMPRESSION: Focal density in the right hilar region which may reflect overlying vascular structures. Enlarged hilar lymph node, airspace opacity, or pulmonary nodule at this site is not excluded. Contrast enhanced CT of the chest could be performed for further evaluation. Electronically Signed   By: Davina Poke D.O.   On: 08/30/2019 16:00   CT Head Wo Contrast  Result Date: 08/30/2019 CLINICAL DATA:  Altered  mental status EXAM: CT HEAD WITHOUT CONTRAST TECHNIQUE: Contiguous axial images were obtained from the base of the skull through the vertex without intravenous contrast. COMPARISON:  MRI 08/05/2015 FINDINGS: Brain: No acute territorial infarction, hemorrhage, or intracranial mass is visualized. Mild atrophy. Stable ventricular enlargement somewhat out of proportion to atrophy. Extensive hypodensity within the white matter consistent with chronic small vessel ischemic change. More focal hypodensity within the right subcortical white matter posteriorly may reflect small vessel ischemic change or age indeterminate white matter infarct. Chronic lacunar infarct in the right thalamus. Vascular: No hyperdense vessels. Vertebral and  carotid vascular calcification Skull: Normal. Negative for fracture or focal lesion. Sinuses/Orbits: No acute finding. Other: None IMPRESSION: 1. Extensive chronic small vessel ischemic change of the white matter. More focal hypodensity within the right posterior subcortical white matter may reflect additional change of small-vessel ischemia however age indeterminate white matter infarct cannot be excluded. 2. Atrophy. Generalized ventricular enlargement somewhat out of proportion to atrophy, possible normal pressure hydrocephalus. Electronically Signed   By: Donavan Foil M.D.   On: 08/30/2019 17:22   CT ANGIO NECK W OR WO CONTRAST  Result Date: 08/30/2019 CLINICAL DATA:  Altered mental status. EXAM: CT HEAD WITHOUT CONTRAST CT ANGIOGRAPHY OF THE HEAD AND NECK TECHNIQUE: Contiguous axial images were obtained from the base of the skull through the vertex without intravenous contrast. Multidetector CT imaging of the head and neck was performed using the standard protocol during bolus administration of intravenous contrast. Multiplanar CT image reconstructions and MIPs were obtained to evaluate the vascular anatomy. Carotid stenosis measurements (when applicable) are obtained utilizing NASCET  criteria, using the distal internal carotid diameter as the denominator. CONTRAST:  126mL OMNIPAQUE IOHEXOL 350 MG/ML SOLN COMPARISON:  CTA of the head and neck dated March 05, 2011 FINDINGS: CT HEAD Brain: There is moderate severity cerebral atrophy with widening of the extra-axial spaces and ventricular dilatation. There are areas of decreased attenuation within the white matter tracts of the supratentorial brain, consistent with microvascular disease changes. Chronic bilateral basal ganglia lacunar infarcts are seen. Vascular: No hyperdense vessel or unexpected calcification. Skull: Normal. Negative for fracture or focal lesion. Sinuses/Orbits: No acute finding. CTA NECK Aortic arch: Moderate severity calcification of the aortic arch with normal origins of the brachiocephalic, left common carotid, and left subclavian arteries. Normal brachiocephalic artery. Normal origin of the right common carotid artery. Normal left subclavian artery, without a hemodynamically significant stenosis. Right carotid system: Normal right common carotid artery (CCA). There is moderate severity calcification of the right common carotid bulb. There is moderate severity calcification of the origin of the right internal carotid (ICA) artery without a hemodynamically significant stenosis. There is marked severity calcification the cervical portion of the right internal carotid artery. Normal origin of the right external carotid artery (ECA). Left carotid system: Normal left common carotid artery (CCA). Normal left common carotid bulb. Normal origin of the left internal carotid (ICA) artery without a hemodynamically significant stenosis. Normal visualized cervical portion of the left internal carotid artery. Normal origin of the left external carotid artery (ECA). Vertebral arteries:There is marked severity calcification of the origin of the left vertebral artery and distal aspect of the right vertebral artery. Skeleton: No acute osseous  abnormalities. Other neck: N/A CTA HEAD Anterior circulation: Normal bilateral petrous carotid arteries. There is mild to moderate severity calcification of the right and left cavernous carotid arteries with a normal supraclinoid bifurcation. Normal right A1 segments of the anterior cerebral artery. Normal left A1 segments of the anterior cerebral artery. Normal intact anterior communicating artery (ACOM). Normal bilateral A2 segments of the anterior cerebral arteries. Normal right M1 and M2 segments of the middle cerebral arteries, with a normal M1 bifurcation. Normal left M1 and M2 segments of the middle cerebral arteries, with a normal M1 bifurcation. Posterior circulation: Normal right posterior communicating artery (PCOM). Normal left posterior communicating artery (PCOM). Normal bilateral vertebral arteries. Normal basilar artery with a normal basilar bifurcation. The visualized bilateral superior cerebellar (SCA) arteries are normal. Normal bilateral P1, P2 and visualized P3 segments of the posterior cerebral arteries. There  is no demonstrated aneurysm of the circle of Willis. Venous sinuses: Unremarkable. Anatomic variants: Normal. Delayed phase: Unremarkable. IMPRESSION: 1. No acute intracranial abnormality. 2. Cerebral atrophy and microvascular disease changes. 3. Chronic bilateral basal ganglia lacunar infarcts. 4. No hemodynamically significant stenosis involving either carotid system. Marked severity calcification at the origin of the left vertebral artery and distal aspect of the right vertebral artery. 5. Mild to moderate severity calcification of the bilateral cavernous carotid arteries without evidence of intracranial aneurysm or hemodynamically significant stenosis. Aortic Atherosclerosis (ICD10-I70.0). Electronically Signed   By: Virgina Norfolk M.D.   On: 08/30/2019 23:51   CT CHEST W CONTRAST  Result Date: 08/30/2019 CLINICAL DATA:  Altered mental status. Evaluate interstitial infiltrate  seen on earlier chest plain film. EXAM: CT CHEST WITH CONTRAST TECHNIQUE: Multidetector CT imaging of the chest was performed during intravenous contrast administration. CONTRAST:  152mL OMNIPAQUE IOHEXOL 350 MG/ML SOLN COMPARISON:  Chest plain film, dated August 30, 2019 FINDINGS: Cardiovascular: There is moderate to marked severity calcification of the thoracic aorta. Normal heart size. No pericardial effusion. Mediastinum/Nodes: No enlarged mediastinal, hilar, or axillary lymph nodes. Subcentimeter cysts are seen within the right lobe of the thyroid gland. Lungs/Pleura: Very mild atelectasis is seen within the bilateral lung bases. There is no evidence of a pleural effusion or pneumothorax. Upper Abdomen: There is a small hiatal hernia. A 1.0 cm focus of parenchymal low attenuation is seen within the posterior aspect of the right lobe of the liver. Musculoskeletal: Multilevel degenerative changes seen throughout the thoracic spine. IMPRESSION: 1. Very mild bilateral lower lobe atelectasis. 2. Small hiatal hernia. 3. Findings likely consistent with a small hepatic cyst versus hemangioma. Aortic Atherosclerosis (ICD10-I70.0). Electronically Signed   By: Virgina Norfolk M.D.   On: 08/30/2019 23:34   MR BRAIN WO CONTRAST  Result Date: 08/30/2019 CLINICAL DATA:  Worsening confusion. EXAM: MRI HEAD WITHOUT CONTRAST TECHNIQUE: Multiplanar, multiecho pulse sequences of the brain and surrounding structures were obtained without intravenous contrast. COMPARISON:  Head CT 08/30/2019 FINDINGS: Brain: There are areas of abnormal diffusion restriction within the right MCA territory, predominantly within the posterior frontal lobe and right parietal lobe. No contralateral ischemia or ischemia and other vascular distribution. Variance in the intensity of the DWI findings may indicate multiple events. Early confluent hyperintense T2-weighted signal of the periventricular and deep white matter, most commonly due to chronic  ischemic microangiopathy. There is generalized atrophy without lobar predilection. No chronic microhemorrhage. Normal midline structures. Vascular: Focus of magnetic susceptibility effect within the proximal M2 segment of the right middle cerebral artery (series 11, image 26). Skull and upper cervical spine: Normal marrow signal. Sinuses/Orbits: Negative. Other: None. IMPRESSION: 1. Multiple areas of acute to early subacute ischemia within the right MCA territory. Varying signal intensity may indicate multiple events. 2. Focus of magnetic susceptibility effect within the proximal M2 segment of the right middle cerebral artery, possibly indicating the site of thrombus. 3. Findings of chronic ischemic microangiopathy and generalized atrophy. Electronically Signed   By: Ulyses Jarred M.D.   On: 08/30/2019 19:31   ECHOCARDIOGRAM COMPLETE  Result Date: 08/31/2019    ECHOCARDIOGRAM REPORT   Patient Name:   Rachel Mcguire Date of Exam: 08/31/2019 Medical Rec #:  [id number]         Height:       62.0 in Accession #:    [id number]        Weight:       100.0 lb Date of Birth:  1931-11-30  BSA:          1.424 m Patient Age:    23 years          BP:           192/82 mmHg Patient Gender: F                 HR:           66 bpm. Exam Location:  Inpatient Procedure: 2D Echo, Cardiac Doppler and Color Doppler Indications:    Stroke  History:        Patient has no prior history of Echocardiogram examinations.                 Stroke, Signs/Symptoms:Alzheimer's and Altered Mental Status;                 Risk Factors:Hypertension and Dyslipidemia.  Sonographer:    Roseanna Rainbow RDCS Referring Phys: [id number] [place]  Sonographer Comments: Technically difficult study due to poor echo windows. Image acquisition challenging due to uncooperative patient. Patient had to be restrained by family. IMPRESSIONS  1. Left ventricular ejection fraction, by estimation, is 60 to 65%. The left ventricle has normal function. The left  ventricle has no regional wall motion abnormalities. There is mild left ventricular hypertrophy. Left ventricular diastolic parameters are consistent with Grade I diastolic dysfunction (impaired relaxation).  2. Right ventricular systolic function is normal. The right ventricular size is normal. There is mildly elevated pulmonary artery systolic pressure.  3. Not well seen possible PFO suggest TEE/Bubble study if clinically appropriate.  4. The mitral valve is normal in structure. Trivial mitral valve regurgitation. No evidence of mitral stenosis.  5. The aortic valve is tricuspid. Aortic valve regurgitation is mild. Moderate sclerosis without stenosis.  6. The inferior vena cava is normal in size with greater than 50% respiratory variability, suggesting right atrial pressure of 3 mmHg. FINDINGS  Left Ventricle: Left ventricular ejection fraction, by estimation, is 60 to 65%. The left ventricle has normal function. The left ventricle has no regional wall motion abnormalities. The left ventricular internal cavity size was normal in size. There is  mild left ventricular hypertrophy. Left ventricular diastolic parameters are consistent with Grade I diastolic dysfunction (impaired relaxation). Right Ventricle: The right ventricular size is normal. No increase in right ventricular wall thickness. Right ventricular systolic function is normal. There is mildly elevated pulmonary artery systolic pressure. The tricuspid regurgitant velocity is 3.01  m/s, and with an assumed right atrial pressure of 8 mmHg, the estimated right ventricular systolic pressure is 99991111 mmHg. Left Atrium: Left atrial size was normal in size. Right Atrium: Right atrial size was normal in size. Pericardium: There is no evidence of pericardial effusion. Mitral Valve: The mitral valve is normal in structure. There is moderate thickening of the mitral valve leaflet(s). There is moderate calcification of the mitral valve leaflet(s). Normal mobility of  the mitral valve leaflets. Trivial mitral valve regurgitation. No evidence of mitral valve stenosis. Tricuspid Valve: The tricuspid valve is normal in structure. Tricuspid valve regurgitation is mild . No evidence of tricuspid stenosis. Aortic Valve: The aortic valve is tricuspid. . There is moderate thickening and moderate calcification of the aortic valve. Aortic valve regurgitation is mild. Aortic regurgitation PHT measures 508 msec. Moderate sclerosis without stenosis. There is moderate thickening of the aortic valve. There is moderate calcification of the aortic valve. Pulmonic Valve: The pulmonic valve was normal in structure. Pulmonic valve regurgitation is not visualized. No  evidence of pulmonic stenosis. Aorta: The aortic root is normal in size and structure. Venous: The inferior vena cava is normal in size with greater than 50% respiratory variability, suggesting right atrial pressure of 3 mmHg. IAS/Shunts: The interatrial septum was not well visualized.  LEFT VENTRICLE PLAX 2D LVIDd:         3.62 cm     Diastology LVIDs:         1.93 cm     LV e' lateral:   6.64 cm/s LV PW:         1.29 cm     LV E/e' lateral: 7.2 LV IVS:        1.38 cm     LV e' medial:    4.03 cm/s LVOT diam:     2.00 cm     LV E/e' medial:  11.8 LV SV:         38 LV SV Index:   26 LVOT Area:     3.14 cm  LV Volumes (MOD) LV vol d, MOD A2C: 64.8 ml LV vol d, MOD A4C: 61.4 ml LV vol s, MOD A2C: 15.9 ml LV vol s, MOD A4C: 17.9 ml LV SV MOD A2C:     48.9 ml LV SV MOD A4C:     61.4 ml LV SV MOD BP:      47.4 ml RIGHT VENTRICLE             IVC RV S prime:     20.40 cm/s  IVC diam: 0.88 cm TAPSE (M-mode): 1.9 cm LEFT ATRIUM             Index       RIGHT ATRIUM           Index LA diam:        3.40 cm 2.39 cm/m  RA Area:     12.50 cm LA Vol (A2C):   28.6 ml 20.09 ml/m RA Volume:   27.00 ml  18.96 ml/m LA Vol (A4C):   39.9 ml 28.02 ml/m LA Biplane Vol: 37.2 ml 26.13 ml/m  AORTIC VALVE             PULMONIC VALVE LVOT Vmax:   76.40 cm/s  PR  End Diast Vel: 1.41 msec LVOT Vmean:  44.600 cm/s LVOT VTI:    0.120 m AI PHT:      508 msec  AORTA Ao Root diam: 2.90 cm Ao Asc diam:  3.40 cm MITRAL VALVE                TRICUSPID VALVE MV Area (PHT): 3.91 cm     TR Peak grad:   36.2 mmHg MV Decel Time: 194 msec     TR Vmax:        301.00 cm/s MV E velocity: 47.70 cm/s MV A velocity: 125.00 cm/s  SHUNTS MV E/A ratio:  0.38         Systemic VTI:  0.12 m                             Systemic Diam: 2.00 cm Jenkins Rouge MD Electronically signed by Jenkins Rouge MD Signature Date/Time: 08/31/2019/3:54:01 PM    Final    VAS US CAROTID (at [facility] and [facility] only)  Result Date: 08/31/2019 Carotid Arterial Duplex Study Indications:       CVA. Risk Factors:      Hypertension. Limitations  Today's exam was limited due to the high bifurcation of the                    carotid, the patient's inability or unwillingness to                    cooperate and patient positioning, patient movement. Comparison Study:  No prior studies. Performing Technologist: Oliver Hum RVT  Examination Guidelines: A complete evaluation includes B-mode imaging, spectral Doppler, color Doppler, and power Doppler as needed of all accessible portions of each vessel. Bilateral testing is considered an integral part of a complete examination. Limited examinations for reoccurring indications may be performed as noted.  Right Carotid Findings: +----------+--------+--------+--------+-----------------------+--------+           PSV cm/sEDV cm/sStenosisPlaque Description     Comments +----------+--------+--------+--------+-----------------------+--------+ CCA Prox  89      7               smooth and heterogenoustortuous +----------+--------+--------+--------+-----------------------+--------+ CCA Distal46      8               smooth and heterogenous         +----------+--------+--------+--------+-----------------------+--------+ ICA Prox  98      11              calcific                tortuous +----------+--------+--------+--------+-----------------------+--------+ ICA Distal49      5                                      tortuous +----------+--------+--------+--------+-----------------------+--------+ ECA       104     7                                               +----------+--------+--------+--------+-----------------------+--------+ +----------+--------+-------+--------+-------------------+           PSV cm/sEDV cmsDescribeArm Pressure (mmHG) +----------+--------+-------+--------+-------------------+ [id number]                                        +----------+--------+-------+--------+-------------------+ +---------+--------+--+--------+-+---------+ VertebralPSV cm/s42EDV cm/s8Antegrade +---------+--------+--+--------+-+---------+  Left Carotid Findings: +----------+--------+--------+--------+-----------------------+--------+           PSV cm/sEDV cm/sStenosisPlaque Description     Comments +----------+--------+--------+--------+-----------------------+--------+ CCA Prox  81      8               smooth and heterogenoustortuous +----------+--------+--------+--------+-----------------------+--------+ CCA Distal59      6               smooth and heterogenous         +----------+--------+--------+--------+-----------------------+--------+ ICA Prox  33      6               smooth and heterogenous         +----------+--------+--------+--------+-----------------------+--------+ ICA Distal27      7                                      tortuous +----------+--------+--------+--------+-----------------------+--------+ ECA       89  9                                               +----------+--------+--------+--------+-----------------------+--------+ +----------+--------+--------+--------+-------------------+           PSV cm/sEDV cm/sDescribeArm Pressure (mmHG)  +----------+--------+--------+--------+-------------------+ [id number]                                         +----------+--------+--------+--------+-------------------+ +---------+--------+--+--------+-+---------+ VertebralPSV cm/s23EDV cm/s5Antegrade +---------+--------+--+--------+-+---------+   Summary: Right Carotid: Velocities in the right ICA are consistent with a 1-39% stenosis. Left Carotid: Velocities in the left ICA are consistent with a 1-39% stenosis. Vertebrals: Bilateral vertebral arteries demonstrate antegrade flow. *See table(s) above for measurements and observations.  Electronically signed by Monica Martinez MD on 08/31/2019 at 2:01:25 PM.    Final    Assessment:  Ms. Rachel Mcguire is a 86 y.o. female with history of dementia presenting with lack of speech and not following commands. Exam is difficult to baseline dementia and global aphaia. MRI shows patchy stroke mostly distal posterior section of RMCA.  Mixed acute/subacute appearing  1. Acute and subacute stroke, RMCA as above- Global aphasia is main symptom at this time. Etiology is possibly embolic vs large vessel athro. Limited wk up per family request for GOC (DNR/DNI), no loop if cryptogenic.  2. HTN- normotensive goals 3. Severe dementia- but was able to speak at baseline.  Plan/RECS:  Confirmed DNR/DNI with son, limited further work up is requested  PT consult, OT consult, Speech consult  Echocardiogram done, results pending  No role in Carotid dopplers   Agree with ASA PR  No role in permissive hypertension  If desired for GOC, may use intensive Statin Therapy - LDL goal < 70  Risk factor modification  Telemetry monitoring, but would not offer Loop recorder given GOC and DNR/DNI  Frequent neuro checks  Fall Precautions  DVT prophelaxis  NPO until swallowing evaluation has been passed (aspirin suppository if needed)      Attending Neurologist's note to follow  Desiree  Metzger-Cihelka, ARNP-C, ANVP-BC Pager: [phone]   NEUROHOSPITALIST ADDENDUM Performed a face to face diagnostic evaluation.   I have reviewed the contents of history and physical exam as documented by PA/ARNP/Resident and agree with above documentation.  I have discussed and formulated the above plan as documented. Edits to the note have been made as needed.  Patient with dementia presents with lethargy-MRI showed right MCA infarcts. Likely atheroembolic versus cardioembolic-needs carotid ultrasound and echocardiogram. Patient has passed swallow evaluation  Stroke team to follow.     Karena Addison Deja Pisarski MD [facility] [id number]   If 7pm to 7am, please call on call as listed on AMION.

## 2019-08-31 NOTE — Progress Notes (Signed)
PROGRESS NOTE    Rachel Mcguire  [id number] DOB: [birth year] DOA: 08/30/2019 PCP: Lajean Manes, MD    Chief Complaint  Patient presents with   Medical Clearance    Brief Narrative:  HPI per Dr. Cyd Silence 86 year old female with past medical history of carotid stenosis status post left endarterectomy, hyperlipidemia, hypertension, dementia who presents from [facility] skilled nursing facility with a several day history of progressively worsening lethargy, confusion and poor oral intake.  Patient is an extremely poor historian secondary to profound lethargy and the majority of the history is been obtained from the son who is at the bedside.  According to son, patient has been exhibiting progressively worsening weakness lethargy and confusion for the past several days.  Is been associated with poor oral intake.  The son also states that he observes a new onset left facial droop but states that the patient has been moving all 4 extremities spontaneously.  There has been no recent evidence of cough, shortness of breath, fever, diarrhea or complaints of abdominal pain.  Patient symptoms continue to progress into the patient was brought into [facility] emergency department for evaluation.  Upon evaluation in the emergency department MRI of the brain was performed revealing multiple areas of varying signal intensity in the right MCA distribution concerning for multiple events with concern for possible thrombus in the proximal M2 branch of the middle cerebral artery.  Case was discussed with Dr. Leonel Ramsay with neurology who recommended admission to [facility] for neurologic evaluation.  He also recommended CT angiogram of the head and neck.  Hospitalist group was then called to assess the patient for admission the hospital.  Assessment & Plan:   Principal Problem:   Stroke due to occlusion of right middle cerebral artery (HCC) Active Problems:   Hypertensive urgency  Dementia without behavioral disturbance (HCC)   Acute metabolic encephalopathy   Dehydration   Leukocytosis   1 acute CVA Patient presenting with progressive worsening weakness, lethargy, confusion with associated poor oral intake and new onset left facial droop. Head CT done negative for any acute abnormalities. MRI head done showed multiple areas of acute to early subacute ischemia within the right MCA territory. Varying signal intensity may indicate multiple events. Focus of magnetic susceptibility effect within the proximal M2 segment of the right middle cerebral artery, possibly indicating the site of thrombus. Findings of chronic ischemic microangiography and generalized atrophy. CT angiogram head and neck was done with no acute intracranial abnormality. Cerebral atrophy and microvascular disease changes. Chronic bilateral basal ganglia lacunar infarcts. No hemodynamically significant stenosis involving either carotid system. Marked severity calcification at the origin of the left vertebral artery and distal aspect of the right vertebral artery. Mild to moderate severity calcification of the bilateral cavernous carotid arteries without evidence of intracranial aneurysm or hemodynamic significant stenosis. 2D echo pending. PT/OT/ST. LDL of 114. Permissive hypertension. TContinue aspirin, statin. Patient awaiting transfer to [facility] for further evaluation by the stroke team.  2. Acute metabolic encephalopathy Likely secondary to problem #1 in the setting of dementia and dehydration. Urinalysis with moderate leukocytes, nitrite negative, 11-20 WBCs. Urine cultures pending. Chest x-ray without any acute infiltrate. Placed empirically on IV Rocephin. Continue IV fluids. Supportive care.  3. Dehydration IV fluids.  4. Leukocytosis Patient pancultured. Urinalysis concerning for possible UTI. Urine cultures pending. CBC pending. Place empirically on IV Rocephin.  5. Hypertensive  urgency Permissive hypertension secondary to problem #1.  6. Dementia without behavioral disturbance    DVT  prophylaxis: Lovenox Code Status: DNR Family Communication: No family at bedside. Disposition:   Status is: Inpatient    Dispo: The patient is from: SNF              Anticipated d/c is to: To be determined.              Anticipated d/c date is: To be determined. Likely back to SNF. Pending stroke work-up.              Patient currently awaiting transfer to [facility] for evaluation by neurology for acute CVA.        Consultants:   Neurology pending  Procedures:   CT head 08/30/2019  CT angio head and neck 08/30/2019  MRI brain 08/30/2019  Carotid Dopplers pending 08/31/2019  2D echo pending  Antimicrobials:  IV Rocephin 08/31/2019   Subjective: Patient laying in bed sleeping. Opens eyes barely to verbal stimuli. Moving extremities spontaneously. No family at bedside.  Objective: Vitals:   08/31/19 0514 08/31/19 0614 08/31/19 0812 08/31/19 0900  BP: (!) 162/73 (!) 187/89 (!) 152/98 (!) 189/76  Pulse: 65 63 62 (!) 52  Resp: 18 18 16 13   Temp:      TempSrc:      SpO2: 93% 94% 94% 96%    Intake/Output Summary (Last 24 hours) at 08/31/2019 0946 Last data filed at 08/31/2019 0300 Gross per 24 hour  Intake 1000 ml  Output --  Net 1000 ml   There were no vitals filed for this visit.  Examination:  General exam: Appears calm and comfortable. Dry mucous membranes. Respiratory system: Clear to auscultation anterior lung fields. Respiratory effort normal. Cardiovascular system: S1 & S2 heard, RRR. No JVD, murmurs, rubs, gallops or clicks. No pedal edema. Gastrointestinal system: Abdomen is nondistended, soft and nontender. No organomegaly or masses felt. Normal bowel sounds heard. Central nervous system: Patient sleeping. Barely opens eyes to verbal stimuli. Moving extremities spontaneously. Extremities: Symmetric 5 x 5 power. Skin: No rashes,  lesions or ulcers Psychiatry: Judgement and insight appear normal. Mood & affect appropriate.     Data Reviewed: I have personally reviewed following labs and imaging studies  CBC: Recent Labs  Lab 08/30/19 1600  WBC 13.2*  NEUTROABS 11.9*  HGB 17.1*  HCT 52.0*  MCV 90.8  PLT [id number]    Basic Metabolic Panel: Recent Labs  Lab 08/30/19 1600  NA 140  K 4.1  CL 102  CO2 25  GLUCOSE 123*  BUN 33*  CREATININE 0.94  CALCIUM 9.9    GFR: CrCl cannot be calculated (Unknown ideal weight.).  Liver Function Tests: Recent Labs  Lab 08/30/19 1600  AST 25  ALT 21  ALKPHOS 87  BILITOT 0.9  PROT 8.0  ALBUMIN 4.6    CBG: No results for input(s): GLUCAP in the last 168 hours.   Recent Results (from the past 240 hour(s))  Respiratory Panel by RT PCR (Flu A&B, Covid) - Nasopharyngeal Swab     Status: None   Collection Time: 08/30/19  8:29 PM   Specimen: Nasopharyngeal Swab  Result Value Ref Range Status   SARS Coronavirus 2 by RT PCR NEGATIVE NEGATIVE Final    Comment: (NOTE) SARS-CoV-2 target nucleic acids are NOT DETECTED. The SARS-CoV-2 RNA is generally detectable in upper respiratoy specimens during the acute phase of infection. The lowest concentration of SARS-CoV-2 viral copies this assay can detect is 131 copies/mL. A negative result does not preclude SARS-Cov-2 infection and should not be used as  the sole basis for treatment or other patient management decisions. A negative result may occur with  improper specimen collection/handling, submission of specimen other than nasopharyngeal swab, presence of viral mutation(s) within the areas targeted by this assay, and inadequate number of viral copies (<131 copies/mL). A negative result must be combined with clinical observations, patient history, and epidemiological information. The expected result is Negative. Fact Sheet for Patients:  PinkCheek.be Fact Sheet for Healthcare Providers:   GravelBags.it This test is not yet ap proved or cleared by the [place] FDA and  has been authorized for detection and/or diagnosis of SARS-CoV-2 by FDA under an Emergency Use Authorization (EUA). This EUA will remain  in effect (meaning this test can be used) for the duration of the COVID-19 declaration under Section 564(b)(1) of the Act, 21 U.S.C. section 360bbb-3(b)(1), unless the authorization is terminated or revoked sooner.    Influenza A by PCR NEGATIVE NEGATIVE Final   Influenza B by PCR NEGATIVE NEGATIVE Final    Comment: (NOTE) The Xpert Xpress SARS-CoV-2/FLU/RSV assay is intended as an aid in  the diagnosis of influenza from Nasopharyngeal swab specimens and  should not be used as a sole basis for treatment. Nasal washings and  aspirates are unacceptable for Xpert Xpress SARS-CoV-2/FLU/RSV  testing. Fact Sheet for Patients: PinkCheek.be Fact Sheet for Healthcare Providers: GravelBags.it This test is not yet approved or cleared by the [place] FDA and  has been authorized for detection and/or diagnosis of SARS-CoV-2 by  FDA under an Emergency Use Authorization (EUA). This EUA will remain  in effect (meaning this test can be used) for the duration of the  Covid-19 declaration under Section 564(b)(1) of the Act, 21  U.S.C. section 360bbb-3(b)(1), unless the authorization is  terminated or revoked. Performed at [facility], [place] [address] [place],  [address]          Radiology Studies: CT ANGIO HEAD W OR WO CONTRAST  Result Date: 08/30/2019 CLINICAL DATA:  Altered mental status. EXAM: CT HEAD WITHOUT CONTRAST CT ANGIOGRAPHY OF THE HEAD AND NECK TECHNIQUE: Contiguous axial images were obtained from the base of the skull through the vertex without intravenous contrast. Multidetector CT imaging of the head and neck was performed using the standard  protocol during bolus administration of intravenous contrast. Multiplanar CT image reconstructions and MIPs were obtained to evaluate the vascular anatomy. Carotid stenosis measurements (when applicable) are obtained utilizing NASCET criteria, using the distal internal carotid diameter as the denominator. CONTRAST:  121mL OMNIPAQUE IOHEXOL 350 MG/ML SOLN COMPARISON:  Plain brain CT, dated August 30, 2018 and MRI head, August 05, 2015 FINDINGS: CT HEAD Brain: There is moderate severity cerebral atrophy with widening of the extra-axial spaces and ventricular dilatation. There are areas of decreased attenuation within the white matter tracts of the supratentorial brain, consistent with microvascular disease changes. Chronic bilateral basal ganglia lacunar infarcts are seen. Vascular: No hyperdense vessel or unexpected calcification. Skull: Normal. Negative for fracture or focal lesion. Sinuses/Orbits: No acute finding. CTA NECK Aortic arch: Moderate severity calcification of the aortic arch with normal origins of the brachiocephalic, left common carotid, and left subclavian arteries. Normal brachiocephalic artery. Normal origin of the right common carotid artery. Normal left subclavian artery, without a hemodynamically significant stenosis. Right carotid system: Normal right common carotid artery (CCA). There is moderate severity calcification of the right common carotid bulb. There is moderate severity calcification of the origin of the right internal carotid (ICA) artery without a hemodynamically significant stenosis.  There is marked severity calcification the cervical portion of the right internal carotid artery. Normal origin of the right external carotid artery (ECA). Left carotid system: Normal left common carotid artery (CCA). Normal left common carotid bulb. Normal origin of the left internal carotid (ICA) artery without a hemodynamically significant stenosis. Normal visualized cervical portion of the left  internal carotid artery. Normal origin of the left external carotid artery (ECA). Vertebral arteries:There is marked severity calcification of the origin of the left vertebral artery and distal aspect of the right vertebral artery. Skeleton: No acute osseous abnormalities. Other neck: N/A CTA HEAD Anterior circulation: Normal bilateral petrous carotid arteries. There is mild to moderate severity calcification of the right and left cavernous carotid arteries with a normal supraclinoid bifurcation. Normal right A1 segments of the anterior cerebral artery. Normal left A1 segments of the anterior cerebral artery. Normal intact anterior communicating artery (ACOM). Normal bilateral A2 segments of the anterior cerebral arteries. Normal right M1 and M2 segments of the middle cerebral arteries, with a normal M1 bifurcation. Normal left M1 and M2 segments of the middle cerebral arteries, with a normal M1 bifurcation. Posterior circulation: Normal right posterior communicating artery (PCOM). Normal left posterior communicating artery (PCOM). Normal bilateral vertebral arteries. Normal basilar artery with a normal basilar bifurcation. The visualized bilateral superior cerebellar (SCA) arteries are normal. Normal bilateral P1, P2 and visualized P3 segments of the posterior cerebral arteries. There is no demonstrated aneurysm of the circle of Willis. Venous sinuses: Unremarkable. Anatomic variants: Normal. Delayed phase: Unremarkable. IMPRESSION: 1. No acute intracranial abnormality. 2. Cerebral atrophy and microvascular disease changes. 3. Chronic bilateral basal ganglia lacunar infarcts. 4. No hemodynamically significant stenosis involving either carotid system. Marked severity calcification at the origin of the left vertebral artery and distal aspect of the right vertebral artery. 5. Mild to moderate severity calcification of the bilateral cavernous carotid arteries without evidence of intracranial aneurysm or hemodynamically  significant stenosis. Aortic Atherosclerosis (ICD10-I70.0). Electronically Signed   By: Virgina Norfolk M.D.   On: 08/30/2019 23:49   DG Chest 1 View  Result Date: 08/30/2019 CLINICAL DATA:  Weakness, altered mental status EXAM: CHEST  1 VIEW COMPARISON:  07/14/2016 FINDINGS: The heart size and mediastinal contours are within normal limits. Atherosclerotic calcification of the aortic knob. Focal density in the right hilar region which may reflect overlying vascular structures. Enlarged hilar lymph node, airspace opacity, or pulmonary nodule at this site is not excluded. Left lung appears clear. No pleural effusion or pneumothorax. Degenerative changes of the spine and right shoulder. IMPRESSION: Focal density in the right hilar region which may reflect overlying vascular structures. Enlarged hilar lymph node, airspace opacity, or pulmonary nodule at this site is not excluded. Contrast enhanced CT of the chest could be performed for further evaluation. Electronically Signed   By: Davina Poke D.O.   On: 08/30/2019 16:00   CT Head Wo Contrast  Result Date: 08/30/2019 CLINICAL DATA:  Altered mental status EXAM: CT HEAD WITHOUT CONTRAST TECHNIQUE: Contiguous axial images were obtained from the base of the skull through the vertex without intravenous contrast. COMPARISON:  MRI 08/05/2015 FINDINGS: Brain: No acute territorial infarction, hemorrhage, or intracranial mass is visualized. Mild atrophy. Stable ventricular enlargement somewhat out of proportion to atrophy. Extensive hypodensity within the white matter consistent with chronic small vessel ischemic change. More focal hypodensity within the right subcortical white matter posteriorly may reflect small vessel ischemic change or age indeterminate white matter infarct. Chronic lacunar infarct in the right thalamus. Vascular:  No hyperdense vessels. Vertebral and carotid vascular calcification Skull: Normal. Negative for fracture or focal lesion.  Sinuses/Orbits: No acute finding. Other: None IMPRESSION: 1. Extensive chronic small vessel ischemic change of the white matter. More focal hypodensity within the right posterior subcortical white matter may reflect additional change of small-vessel ischemia however age indeterminate white matter infarct cannot be excluded. 2. Atrophy. Generalized ventricular enlargement somewhat out of proportion to atrophy, possible normal pressure hydrocephalus. Electronically Signed   By: Donavan Foil M.D.   On: 08/30/2019 17:22   CT ANGIO NECK W OR WO CONTRAST  Result Date: 08/30/2019 CLINICAL DATA:  Altered mental status. EXAM: CT HEAD WITHOUT CONTRAST CT ANGIOGRAPHY OF THE HEAD AND NECK TECHNIQUE: Contiguous axial images were obtained from the base of the skull through the vertex without intravenous contrast. Multidetector CT imaging of the head and neck was performed using the standard protocol during bolus administration of intravenous contrast. Multiplanar CT image reconstructions and MIPs were obtained to evaluate the vascular anatomy. Carotid stenosis measurements (when applicable) are obtained utilizing NASCET criteria, using the distal internal carotid diameter as the denominator. CONTRAST:  136mL OMNIPAQUE IOHEXOL 350 MG/ML SOLN COMPARISON:  CTA of the head and neck dated March 05, 2011 FINDINGS: CT HEAD Brain: There is moderate severity cerebral atrophy with widening of the extra-axial spaces and ventricular dilatation. There are areas of decreased attenuation within the white matter tracts of the supratentorial brain, consistent with microvascular disease changes. Chronic bilateral basal ganglia lacunar infarcts are seen. Vascular: No hyperdense vessel or unexpected calcification. Skull: Normal. Negative for fracture or focal lesion. Sinuses/Orbits: No acute finding. CTA NECK Aortic arch: Moderate severity calcification of the aortic arch with normal origins of the brachiocephalic, left common carotid, and  left subclavian arteries. Normal brachiocephalic artery. Normal origin of the right common carotid artery. Normal left subclavian artery, without a hemodynamically significant stenosis. Right carotid system: Normal right common carotid artery (CCA). There is moderate severity calcification of the right common carotid bulb. There is moderate severity calcification of the origin of the right internal carotid (ICA) artery without a hemodynamically significant stenosis. There is marked severity calcification the cervical portion of the right internal carotid artery. Normal origin of the right external carotid artery (ECA). Left carotid system: Normal left common carotid artery (CCA). Normal left common carotid bulb. Normal origin of the left internal carotid (ICA) artery without a hemodynamically significant stenosis. Normal visualized cervical portion of the left internal carotid artery. Normal origin of the left external carotid artery (ECA). Vertebral arteries:There is marked severity calcification of the origin of the left vertebral artery and distal aspect of the right vertebral artery. Skeleton: No acute osseous abnormalities. Other neck: N/A CTA HEAD Anterior circulation: Normal bilateral petrous carotid arteries. There is mild to moderate severity calcification of the right and left cavernous carotid arteries with a normal supraclinoid bifurcation. Normal right A1 segments of the anterior cerebral artery. Normal left A1 segments of the anterior cerebral artery. Normal intact anterior communicating artery (ACOM). Normal bilateral A2 segments of the anterior cerebral arteries. Normal right M1 and M2 segments of the middle cerebral arteries, with a normal M1 bifurcation. Normal left M1 and M2 segments of the middle cerebral arteries, with a normal M1 bifurcation. Posterior circulation: Normal right posterior communicating artery (PCOM). Normal left posterior communicating artery (PCOM). Normal bilateral vertebral  arteries. Normal basilar artery with a normal basilar bifurcation. The visualized bilateral superior cerebellar (SCA) arteries are normal. Normal bilateral P1, P2 and visualized P3 segments  of the posterior cerebral arteries. There is no demonstrated aneurysm of the circle of Willis. Venous sinuses: Unremarkable. Anatomic variants: Normal. Delayed phase: Unremarkable. IMPRESSION: 1. No acute intracranial abnormality. 2. Cerebral atrophy and microvascular disease changes. 3. Chronic bilateral basal ganglia lacunar infarcts. 4. No hemodynamically significant stenosis involving either carotid system. Marked severity calcification at the origin of the left vertebral artery and distal aspect of the right vertebral artery. 5. Mild to moderate severity calcification of the bilateral cavernous carotid arteries without evidence of intracranial aneurysm or hemodynamically significant stenosis. Aortic Atherosclerosis (ICD10-I70.0). Electronically Signed   By: Virgina Norfolk M.D.   On: 08/30/2019 23:51   CT CHEST W CONTRAST  Result Date: 08/30/2019 CLINICAL DATA:  Altered mental status. Evaluate interstitial infiltrate seen on earlier chest plain film. EXAM: CT CHEST WITH CONTRAST TECHNIQUE: Multidetector CT imaging of the chest was performed during intravenous contrast administration. CONTRAST:  172mL OMNIPAQUE IOHEXOL 350 MG/ML SOLN COMPARISON:  Chest plain film, dated August 30, 2019 FINDINGS: Cardiovascular: There is moderate to marked severity calcification of the thoracic aorta. Normal heart size. No pericardial effusion. Mediastinum/Nodes: No enlarged mediastinal, hilar, or axillary lymph nodes. Subcentimeter cysts are seen within the right lobe of the thyroid gland. Lungs/Pleura: Very mild atelectasis is seen within the bilateral lung bases. There is no evidence of a pleural effusion or pneumothorax. Upper Abdomen: There is a small hiatal hernia. A 1.0 cm focus of parenchymal low attenuation is seen within the  posterior aspect of the right lobe of the liver. Musculoskeletal: Multilevel degenerative changes seen throughout the thoracic spine. IMPRESSION: 1. Very mild bilateral lower lobe atelectasis. 2. Small hiatal hernia. 3. Findings likely consistent with a small hepatic cyst versus hemangioma. Aortic Atherosclerosis (ICD10-I70.0). Electronically Signed   By: Virgina Norfolk M.D.   On: 08/30/2019 23:34   MR BRAIN WO CONTRAST  Result Date: 08/30/2019 CLINICAL DATA:  Worsening confusion. EXAM: MRI HEAD WITHOUT CONTRAST TECHNIQUE: Multiplanar, multiecho pulse sequences of the brain and surrounding structures were obtained without intravenous contrast. COMPARISON:  Head CT 08/30/2019 FINDINGS: Brain: There are areas of abnormal diffusion restriction within the right MCA territory, predominantly within the posterior frontal lobe and right parietal lobe. No contralateral ischemia or ischemia and other vascular distribution. Variance in the intensity of the DWI findings may indicate multiple events. Early confluent hyperintense T2-weighted signal of the periventricular and deep white matter, most commonly due to chronic ischemic microangiopathy. There is generalized atrophy without lobar predilection. No chronic microhemorrhage. Normal midline structures. Vascular: Focus of magnetic susceptibility effect within the proximal M2 segment of the right middle cerebral artery (series 11, image 26). Skull and upper cervical spine: Normal marrow signal. Sinuses/Orbits: Negative. Other: None. IMPRESSION: 1. Multiple areas of acute to early subacute ischemia within the right MCA territory. Varying signal intensity may indicate multiple events. 2. Focus of magnetic susceptibility effect within the proximal M2 segment of the right middle cerebral artery, possibly indicating the site of thrombus. 3. Findings of chronic ischemic microangiopathy and generalized atrophy. Electronically Signed   By: Ulyses Jarred M.D.   On: 08/30/2019  19:31        Scheduled Meds:   stroke: mapping our early stages of recovery book   Does not apply Once   aspirin  300 mg Rectal Daily   Or   aspirin  325 mg Oral Daily   atorvastatin  80 mg Oral Daily   sodium chloride (PF)       Continuous Infusions:  lactated ringers Stopped (08/30/19  2239)     LOS: 1 day    Time spent: 35 minutes    Irine Seal, MD [facility]   To contact the attending provider between 7A-7P or the covering provider during after hours 7P-7A, please log into the web site [email or web address] and access using universal [place] password for that web site. If you do not have the password, please call the hospital operator.  08/31/2019, 9:46 AM

## 2019-09-01 ENCOUNTER — Inpatient Hospital Stay (HOSPITAL_COMMUNITY): Payer: Medicare PPO

## 2019-09-01 ENCOUNTER — Other Ambulatory Visit: Payer: Self-pay

## 2019-09-01 DIAGNOSIS — I63511 Cerebral infarction due to unspecified occlusion or stenosis of right middle cerebral artery: Secondary | ICD-10-CM

## 2019-09-01 DIAGNOSIS — G9341 Metabolic encephalopathy: Secondary | ICD-10-CM

## 2019-09-01 DIAGNOSIS — I639 Cerebral infarction, unspecified: Secondary | ICD-10-CM

## 2019-09-01 DIAGNOSIS — I6601 Occlusion and stenosis of right middle cerebral artery: Secondary | ICD-10-CM

## 2019-09-01 DIAGNOSIS — F0150 Vascular dementia without behavioral disturbance: Secondary | ICD-10-CM

## 2019-09-01 DIAGNOSIS — I10 Essential (primary) hypertension: Secondary | ICD-10-CM

## 2019-09-01 DIAGNOSIS — E876 Hypokalemia: Secondary | ICD-10-CM

## 2019-09-01 LAB — BASIC METABOLIC PANEL
Anion gap: 11 (ref 5–15)
BUN: 14 mg/dL (ref 8–23)
CO2: 24 mmol/L (ref 22–32)
Calcium: 9.1 mg/dL (ref 8.9–10.3)
Chloride: 103 mmol/L (ref 98–111)
Creatinine, Ser: 0.95 mg/dL (ref 0.44–1.00)
GFR calc Af Amer: >60 mL/min (ref >60)
GFR calc non Af Amer: 55 mL/min — ABNORMAL LOW (ref >60)
Glucose, Bld: 96 mg/dL (ref 70–99)
Potassium: 3.5 mmol/L (ref 3.5–5.1)
Sodium: 138 mmol/L (ref 135–145)

## 2019-09-01 LAB — CBC
HCT: 44 % (ref 36.0–46.0)
Hemoglobin: 14.5 g/dL (ref 12.0–15.0)
MCH: 29.5 pg (ref 26.0–34.0)
MCHC: 33 g/dL (ref 30.0–36.0)
MCV: 89.4 fL (ref 80.0–100.0)
Platelets: 286 10*3/uL (ref 150–400)
RBC: 4.92 MIL/uL (ref 3.87–5.11)
RDW: 12.1 % (ref 11.5–15.5)
WBC: 6.5 10*3/uL (ref 4.0–10.5)
nRBC: 0 % (ref 0.0–0.2)

## 2019-09-01 LAB — MAGNESIUM: Magnesium: 1.8 mg/dL (ref 1.7–2.4)

## 2019-09-01 LAB — URINE CULTURE: Culture: 90000 — AB

## 2019-09-01 MED ORDER — SODIUM CHLORIDE 0.9% FLUSH
10.0000 mL | Freq: Two times a day (BID) | INTRAVENOUS | Status: DC
  Administered 2019-09-01 – 2019-09-02 (×2): 10 mL
  Administered 2019-09-02: 40 mL

## 2019-09-01 MED ORDER — AMLODIPINE BESYLATE 5 MG PO TABS
5.0000 mg | ORAL_TABLET | Freq: Every day | ORAL | Status: DC
  Administered 2019-09-01 – 2019-09-02 (×2): 5 mg via ORAL
  Filled 2019-09-01 (×2): qty 1

## 2019-09-01 MED ORDER — SODIUM CHLORIDE 0.9% FLUSH: 10.0000 mL | INTRAVENOUS | Status: DC | PRN

## 2019-09-01 MED ORDER — ASPIRIN EC 325 MG PO TBEC
325.0000 mg | DELAYED_RELEASE_TABLET | Freq: Every day | ORAL | Status: DC
  Administered 2019-09-02 – 2019-09-03 (×2): 325 mg via ORAL
  Filled 2019-09-01 (×2): qty 1

## 2019-09-01 MED ORDER — MAGNESIUM SULFATE 2 GM/50ML IV SOLN
2.0000 g | Freq: Once | INTRAVENOUS | Status: AC
  Administered 2019-09-01: 2 g via INTRAVENOUS
  Filled 2019-09-01: qty 50

## 2019-09-01 MED ORDER — LORAZEPAM 2 MG/ML IJ SOLN: 0.5000 mg | Freq: Four times a day (QID) | INTRAMUSCULAR | Status: DC | PRN

## 2019-09-01 MED ORDER — POTASSIUM CHLORIDE 10 MEQ/100ML IV SOLN
10.0000 meq | INTRAVENOUS | Status: AC
  Administered 2019-09-01 (×4): 10 meq via INTRAVENOUS
  Filled 2019-09-01 (×5): qty 100

## 2019-09-01 NOTE — Procedures (Signed)
ELECTROENCEPHALOGRAM REPORT   Patient: Rachel Mcguire       Room #: [id number] EEG No. ID: [id number] Age: 86 y.o.        Sex: female Requesting Physician: Erlinda Hong Report Date:  09/01/2019        Interpreting Physician: Alexis Goodell  History: [name] is an 86 y.o. female with altered mental status  Medications:  Norvasc, Rocephin, Lipitor, Magnesium  Conditions of Recording:  This is a 21 channel routine scalp EEG performed with bipolar and monopolar montages arranged in accordance to the international 10/20 system of electrode placement. One channel was dedicated to EKG recording.  The patient is in the awake state.  Description:  The waking background activity is poorly organized.  It consists of a low voltage mixture of frequencies with theta and delta rhythms being most predominant.  This background activity is continuous and diffusely distributed.   No epileptiform activity is noted.   Hyperventilation and intermittent photic stimulation were not performed.   IMPRESSION: This is an abnormal EEG secondary to general background slowing.  This finding may be seen with a diffuse disturbance that is etiologically nonspecific, but may include a metabolic encephalopathy, among other possibilities.  No epileptiform activity was noted.     Alexis Goodell, MD Neurology [phone] 09/01/2019, 4:31 PM

## 2019-09-01 NOTE — Progress Notes (Signed)
Following MEWS guidelines. VS now in green.

## 2019-09-01 NOTE — Progress Notes (Signed)
Verification of VS from 1500, no new actions needed, following MEWS guidelines.

## 2019-09-01 NOTE — Progress Notes (Signed)
Patient bp trending up. Notified MD

## 2019-09-01 NOTE — Progress Notes (Signed)
EEG complete - results pending 

## 2019-09-01 NOTE — Progress Notes (Signed)
Bilateral lower extremity venous duplex has been completed. Preliminary results can be found in CV Proc through chart review.   09/01/19 12:26 PM Rachel Mcguire RVT

## 2019-09-01 NOTE — Progress Notes (Signed)
OT Cancellation Note  Patient Details Name: Rachel Mcguire MRN: [id number] DOB: 22-Sep-1931   Cancelled Treatment:    Reason Eval/Treat Not Completed: Patient not medically ready(Noted pt with multiple episodes of tremors/rigidity this PM. Currently getting EEG, will await results before initiating OT POC.)  Zenovia Jarred, [place], OTR/L [facility] Office Number: [phone] Pager: [place] 09/01/2019, 4:07 PM

## 2019-09-01 NOTE — Progress Notes (Signed)
PT Cancellation Note  Patient Details Name: Rachel Mcguire MRN: [id number] DOB: 05/27/1931   Cancelled Treatment:    Reason Eval/Treat Not Completed: Active bedrest order;Patient not medically ready  Bedrest order entered after activity order. Patient's SBP>180.  Will attempt to see later today if bedrest discontinued and BP parameters are met.    Arby Barrette, PT Pager [phone]   Rexanne Mano 09/01/2019, 9:33 AM

## 2019-09-01 NOTE — Progress Notes (Addendum)
PROGRESS NOTE  Rachel Mcguire IFO:[id number] DOB: 1931-08-10 DOA: 08/30/2019 PCP: Lajean Manes, MD  Brief summary:   HPI/Recap of past 66 hours:  86 year old female with past medical history of carotid stenosis status post left endarterectomy, hyperlipidemia, hypertension, dementia who presents from [facility] skilled nursing facility with a several day history of progressively worsening lethargy, confusion and poor oral intake.  Assessment/Plan: Principal Problem:   Stroke due to occlusion of right middle cerebral artery (HCC) Active Problems:   Hypertensive urgency   Dementia without behavioral disturbance (HCC)   Acute metabolic encephalopathy   Dehydration   Leukocytosis   Acute CVA (cerebrovascular accident) ([employer])  CVA: Multiple areas of acute to early subacute ischemia within the right MCA territory - most likely large vessel disease. cardioembolic stroke can not be rule out completely Currently on aspirin Plavix and statin,, allow permissive hypertension, gradually restart home medication Norvasc PT/OT/speech eval, currently on dysphagia 1 diet, may need modified barium swallow  Neurology consulted, will follow recommendation    Jerky movement We will get EEG to rule out seizure, start seizure precaution  Acute metabolic encephalopathy with underlying dementia (at baseline she does not recognize family by report) Acute worsening of mental status likely due to combination of CVA and UTI Appear more alert today  UTI, urine culture grew gram-negative rods, continue Rocephin, leukocytosis normalized on Rocephin ,follow-up on final culture result  Hypokalemia and hypomagnesemia Replace through IV, repeat lab in the morning  Hypertension; gradually restart Norvasc  Body mass index is 28.78 kg/m.   DVT Prophylaxis: Lovenox  Code Status: DNR  Family Communication: Family at bedside  Disposition Plan:    Patient came from:              [place] [facility]                                                                                              Anticipated d/c place:  Will need skilled level care at [facility]  Barriers to d/c OR conditions which need to be met to effect a safe d/c:  Ender stroke work-up, seizure work-up, treating UTI, not ready to discharge   Consultants:  Neurology  Procedures:  EEG  Antibiotics:  Rocephin   Objective: BP (!) 175/93 (BP Location: Right Arm)   Pulse 80   Temp 97.8 F (36.6 C) (Oral)   Resp 16   Ht 5' 1" (1.549 m)   Wt 69.1 kg   SpO2 96%   BMI 28.78 kg/m   Intake/Output Summary (Last 24 hours) at 09/01/2019 1243 Last data filed at 09/01/2019 0500 Gross per 24 hour  Intake 100 ml  Output 400 ml  Net -300 ml   Filed Weights   09/01/19 0356  Weight: 69.1 kg    Exam: Patient is examined daily including today on 09/01/2019, exams remain the same as of yesterday except that has changed    General: More alert, say a few words, does not follow command  Cardiovascular: RRR  Respiratory: CTABL  Abdomen: Soft/ND/NT, positive BS  Musculoskeletal: No Edema  Neuro: More alert, say a few words, does not  follow command, moving all extremities spontaneously  Data Reviewed: Basic Metabolic Panel: Recent Labs  Lab 08/30/19 1600 08/31/19 1036 09/01/19 0416  NA 140 138 138  K 4.1 3.5 3.5  CL 102 107 103  CO2 _0 GLUCOSE 123* 100* 96  BUN 33* 17 14  CREATININE 0.94 0.65 0.95  CALCIUM 9.9 8.4* 9.1  MG  --   --  1.8   Liver Function Tests: Recent Labs  Lab 08/30/19 1600  AST 25  ALT 21  ALKPHOS 87  BILITOT 0.9  PROT 8.0  ALBUMIN 4.6   No results for input(s): LIPASE, AMYLASE in the last 168 hours. No results for input(s): AMMONIA in the last 168 hours. CBC: Recent Labs  Lab 08/30/19 1600 08/31/19 1036 09/01/19 0416  WBC 13.2* 7.1 6.5  NEUTROABS 11.9* 5.4  --   HGB 17.1* 14.0 14.5  HCT 52.0* 42.9 44.0  MCV 90.8 92.1 89.4  PLT 342 272 286   Cardiac Enzymes:   No  results for input(s): CKTOTAL, CKMB, CKMBINDEX, TROPONINI in the last 168 hours. BNP (last 3 results) No results for input(s): BNP in the last 8760 hours.  ProBNP (last 3 results) No results for input(s): PROBNP in the last 8760 hours.  CBG: No results for input(s): GLUCAP in the last 168 hours.  Recent Results (from the past 240 hour(s))  Respiratory Panel by RT PCR (Flu A&B, Covid) - Nasopharyngeal Swab     Status: None   Collection Time: 08/30/19  8:29 PM   Specimen: Nasopharyngeal Swab  Result Value Ref Range Status   SARS Coronavirus 2 by RT PCR NEGATIVE NEGATIVE Final    Comment: (NOTE) SARS-CoV-2 target nucleic acids are NOT DETECTED. The SARS-CoV-2 RNA is generally detectable in upper respiratoy specimens during the acute phase of infection. The lowest concentration of SARS-CoV-2 viral copies this assay can detect is 131 copies/mL. A negative result does not preclude SARS-Cov-2 infection and should not be used as the sole basis for treatment or other patient management decisions. A negative result may occur with  improper specimen collection/handling, submission of specimen other than nasopharyngeal swab, presence of viral mutation(s) within the areas targeted by this assay, and inadequate number of viral copies (<131 copies/mL). A negative result must be combined with clinical observations, patient history, and epidemiological information. The expected result is Negative. Fact Sheet for Patients:  PinkCheek.be Fact Sheet for Healthcare Providers:  GravelBags.it This test is not yet ap proved or cleared by the [place] FDA and  has been authorized for detection and/or diagnosis of SARS-CoV-2 by FDA under an Emergency Use Authorization (EUA). This EUA will remain  in effect (meaning this test can be used) for the duration of the COVID-19 declaration under Section 564(b)(1) of the Act, 21 U.S.C. section  360bbb-3(b)(1), unless the authorization is terminated or revoked sooner.    Influenza A by PCR NEGATIVE NEGATIVE Final   Influenza B by PCR NEGATIVE NEGATIVE Final    Comment: (NOTE) The Xpert Xpress SARS-CoV-2/FLU/RSV assay is intended as an aid in  the diagnosis of influenza from Nasopharyngeal swab specimens and  should not be used as a sole basis for treatment. Nasal washings and  aspirates are unacceptable for Xpert Xpress SARS-CoV-2/FLU/RSV  testing. Fact Sheet for Patients: PinkCheek.be Fact Sheet for Healthcare Providers: GravelBags.it This test is not yet approved or cleared by the [place] FDA and  has been authorized for detection and/or diagnosis of SARS-CoV-2 by  FDA under an Emergency Use  Authorization (EUA). This EUA will remain  in effect (meaning this test can be used) for the duration of the  Covid-19 declaration under Section 564(b)(1) of the Act, 21  U.S.C. section 360bbb-3(b)(1), unless the authorization is  terminated or revoked. Performed at [facility], [place] [address], [place], [place] [address]   Culture, Urine     Status: Abnormal (Preliminary result)   Collection Time: 08/30/19  9:44 PM   Specimen: Urine, Clean Catch  Result Value Ref Range Status   Specimen Description   Final    URINE, CLEAN CATCH Performed at [facility], [place] [address] [place], [place] [address]    Special Requests   Final    NONE Performed at [facility], [place] [address] [place], [place] [address]    Culture (A)  Final    90,000 COLONIES/mL Lonell Grandchild NEGATIVE RODS IDENTIFICATION AND SUSCEPTIBILITIES TO FOLLOW Performed at [facility], [place] [address] [place], [place] [address]    Report Status PENDING  Incomplete     Studies: ECHOCARDIOGRAM COMPLETE  Result Date: 08/31/2019    ECHOCARDIOGRAM REPORT   Patient Name:   RAMONA RUARK Date of Exam:  08/31/2019 Medical Rec #:  [id number]         Height:       62.0 in Accession #:    [id number]        Weight:       100.0 lb Date of Birth:  December 26, 1931         BSA:          1.424 m Patient Age:    75 years          BP:           192/82 mmHg Patient Gender: F                 HR:           66 bpm. Exam Location:  Inpatient Procedure: 2D Echo, Cardiac Doppler and Color Doppler Indications:    Stroke  History:        Patient has no prior history of Echocardiogram examinations.                 Stroke, Signs/Symptoms:Alzheimer's and Altered Mental Status;                 Risk Factors:Hypertension and Dyslipidemia.  Sonographer:    Roseanna Rainbow RDCS Referring Phys: [id number] [place]  Sonographer Comments: Technically difficult study due to poor echo windows. Image acquisition challenging due to uncooperative patient. Patient had to be restrained by family. IMPRESSIONS  1. Left ventricular ejection fraction, by estimation, is 60 to 65%. The left ventricle has normal function. The left ventricle has no regional wall motion abnormalities. There is mild left ventricular hypertrophy. Left ventricular diastolic parameters are consistent with Grade I diastolic dysfunction (impaired relaxation).  2. Right ventricular systolic function is normal. The right ventricular size is normal. There is mildly elevated pulmonary artery systolic pressure.  3. Not well seen possible PFO suggest TEE/Bubble study if clinically appropriate.  4. The mitral valve is normal in structure. Trivial mitral valve regurgitation. No evidence of mitral stenosis.  5. The aortic valve is tricuspid. Aortic valve regurgitation is mild. Moderate sclerosis without stenosis.  6. The inferior vena cava is normal in size with greater than 50% respiratory variability, suggesting right atrial pressure of 3 mmHg. FINDINGS  Left Ventricle: Left ventricular ejection  fraction, by estimation, is 60 to 65%. The left ventricle has normal function. The left ventricle has  no regional wall motion abnormalities. The left ventricular internal cavity size was normal in size. There is  mild left ventricular hypertrophy. Left ventricular diastolic parameters are consistent with Grade I diastolic dysfunction (impaired relaxation). Right Ventricle: The right ventricular size is normal. No increase in right ventricular wall thickness. Right ventricular systolic function is normal. There is mildly elevated pulmonary artery systolic pressure. The tricuspid regurgitant velocity is 3.01  m/s, and with an assumed right atrial pressure of 8 mmHg, the estimated right ventricular systolic pressure is 16.1 mmHg. Left Atrium: Left atrial size was normal in size. Right Atrium: Right atrial size was normal in size. Pericardium: There is no evidence of pericardial effusion. Mitral Valve: The mitral valve is normal in structure. There is moderate thickening of the mitral valve leaflet(s). There is moderate calcification of the mitral valve leaflet(s). Normal mobility of the mitral valve leaflets. Trivial mitral valve regurgitation. No evidence of mitral valve stenosis. Tricuspid Valve: The tricuspid valve is normal in structure. Tricuspid valve regurgitation is mild . No evidence of tricuspid stenosis. Aortic Valve: The aortic valve is tricuspid. . There is moderate thickening and moderate calcification of the aortic valve. Aortic valve regurgitation is mild. Aortic regurgitation PHT measures 508 msec. Moderate sclerosis without stenosis. There is moderate thickening of the aortic valve. There is moderate calcification of the aortic valve. Pulmonic Valve: The pulmonic valve was normal in structure. Pulmonic valve regurgitation is not visualized. No evidence of pulmonic stenosis. Aorta: The aortic root is normal in size and structure. Venous: The inferior vena cava is normal in size with greater than 50% respiratory variability, suggesting right atrial pressure of 3 mmHg. IAS/Shunts: The interatrial septum  was not well visualized.  LEFT VENTRICLE PLAX 2D LVIDd:         3.62 cm     Diastology LVIDs:         1.93 cm     LV e' lateral:   6.64 cm/s LV PW:         1.29 cm     LV E/e' lateral: 7.2 LV IVS:        1.38 cm     LV e' medial:    4.03 cm/s LVOT diam:     2.00 cm     LV E/e' medial:  11.8 LV SV:         38 LV SV Index:   26 LVOT Area:     3.14 cm  LV Volumes (MOD) LV vol d, MOD A2C: 64.8 ml LV vol d, MOD A4C: 61.4 ml LV vol s, MOD A2C: 15.9 ml LV vol s, MOD A4C: 17.9 ml LV SV MOD A2C:     48.9 ml LV SV MOD A4C:     61.4 ml LV SV MOD BP:      47.4 ml RIGHT VENTRICLE             IVC RV S prime:     20.40 cm/s  IVC diam: 0.88 cm TAPSE (M-mode): 1.9 cm LEFT ATRIUM             Index       RIGHT ATRIUM           Index LA diam:        3.40 cm 2.39 cm/m  RA Area:     12.50 cm LA Vol (A2C):   28.6 ml 20.09 ml/m RA  Volume:   27.00 ml  18.96 ml/m LA Vol (A4C):   39.9 ml 28.02 ml/m LA Biplane Vol: 37.2 ml 26.13 ml/m  AORTIC VALVE             PULMONIC VALVE LVOT Vmax:   76.40 cm/s  PR End Diast Vel: 1.41 msec LVOT Vmean:  44.600 cm/s LVOT VTI:    0.120 m AI PHT:      508 msec  AORTA Ao Root diam: 2.90 cm Ao Asc diam:  3.40 cm MITRAL VALVE                TRICUSPID VALVE MV Area (PHT): 3.91 cm     TR Peak grad:   36.2 mmHg MV Decel Time: 194 msec     TR Vmax:        301.00 cm/s MV E velocity: 47.70 cm/s MV A velocity: 125.00 cm/s  SHUNTS MV E/A ratio:  0.38         Systemic VTI:  0.12 m                             Systemic Diam: 2.00 cm Jenkins Rouge MD Electronically signed by Jenkins Rouge MD Signature Date/Time: 08/31/2019/3:54:01 PM    Final    VAS [place] LOWER EXTREMITY VENOUS (DVT)  Result Date: 09/01/2019  Lower Venous DVTStudy Indications: Stroke.  Risk Factors: None identified. Limitations: Body habitus, poor ultrasound/tissue interface and patient positioning, patient movement, poor patient cooperation. Comparison Study: No prior studies. Performing Technologist: Oliver Hum RVT  Examination Guidelines: A  complete evaluation includes B-mode imaging, spectral Doppler, color Doppler, and power Doppler as needed of all accessible portions of each vessel. Bilateral testing is considered an integral part of a complete examination. Limited examinations for reoccurring indications may be performed as noted. The reflux portion of the exam is performed with the patient in reverse Trendelenburg.  +---------+---------------+---------+-----------+----------+--------------+ RIGHT    CompressibilityPhasicitySpontaneityPropertiesThrombus Aging +---------+---------------+---------+-----------+----------+--------------+ CFV      Full           Yes      Yes                                 +---------+---------------+---------+-----------+----------+--------------+ SFJ      Full                                                        +---------+---------------+---------+-----------+----------+--------------+ FV Prox  Full                                                        +---------+---------------+---------+-----------+----------+--------------+ FV Mid   Full                                                        +---------+---------------+---------+-----------+----------+--------------+ FV DistalFull                                                        +---------+---------------+---------+-----------+----------+--------------+  PFV      Full                                                        +---------+---------------+---------+-----------+----------+--------------+ POP                                                   Not visualized +---------+---------------+---------+-----------+----------+--------------+ PTV      Full                                                        +---------+---------------+---------+-----------+----------+--------------+ PERO                                                  Not visualized  +---------+---------------+---------+-----------+----------+--------------+   +---------+---------------+---------+-----------+----------+--------------+ LEFT     CompressibilityPhasicitySpontaneityPropertiesThrombus Aging +---------+---------------+---------+-----------+----------+--------------+ CFV      Full           Yes      Yes                                 +---------+---------------+---------+-----------+----------+--------------+ SFJ      Full                                                        +---------+---------------+---------+-----------+----------+--------------+ FV Prox  Full                                                        +---------+---------------+---------+-----------+----------+--------------+ FV Mid   Full                                                        +---------+---------------+---------+-----------+----------+--------------+ FV DistalFull                                                        +---------+---------------+---------+-----------+----------+--------------+ PFV      Full                                                        +---------+---------------+---------+-----------+----------+--------------+  POP      Full           Yes      Yes                                 +---------+---------------+---------+-----------+----------+--------------+ PTV      Full                                                        +---------+---------------+---------+-----------+----------+--------------+ PERO                                                  Not visualized +---------+---------------+---------+-----------+----------+--------------+     Summary: RIGHT: - There is no evidence of deep vein thrombosis in the lower extremity. However, portions of this examination were limited- see technologist comments above.  - No cystic structure found in the popliteal fossa.  LEFT: - There is no evidence of deep vein  thrombosis in the lower extremity. However, portions of this examination were limited- see technologist comments above.  - No cystic structure found in the popliteal fossa.  *See table(s) above for measurements and observations.    Preliminary     Scheduled Meds: . aspirin EC  81 mg Oral Daily  . atorvastatin  40 mg Oral Daily  . clopidogrel  75 mg Oral Daily  . enoxaparin (LOVENOX) injection  40 mg Subcutaneous Q24H    Continuous Infusions: . cefTRIAXone (ROCEPHIN)  IV 1 g (08/31/19 1541)     Time spent: 31mns I have personally reviewed and interpreted on  09/01/2019 daily labs, tele strips, imagings as discussed above under date review session and assessment and plans.  I reviewed all nursing notes, pharmacy notes, consultant notes,  vitals, pertinent old records  I have discussed plan of care as described above with RN , patient and family on 09/01/2019   FFlorencia ReasonsMD, PhD, FACP  [facility]  Available via Epic secure chat 7am-7pm for nonurgent issues Please page for urgent issues, pager number available through [email or web address] .   09/01/2019, 12:43 PM  LOS: 2 days

## 2019-09-01 NOTE — Progress Notes (Signed)
PT Cancellation Note  Patient Details Name: Rachel Mcguire MRN: [id number] DOB: 07-20-1931   Cancelled Treatment:    Reason Eval/Treat Not Completed: Medical issues which prohibited therapy  Noted pt with multiple episodes of tremors/rigidity. Currently getting EEG.    Arby Barrette, PT Pager [phone]   Rexanne Mano 09/01/2019, 3:16 PM

## 2019-09-01 NOTE — Progress Notes (Signed)
PIV consult: Request for second site. L forearm site found to be red and edematous. L midline placed in upper arm. 24g in R forearm for incompatible meds.

## 2019-09-01 NOTE — Progress Notes (Signed)
STROKE TEAM PROGRESS NOTE   INTERVAL HISTORY Her step daughter and RN are at the bedside.  Pt lying in bed, trying to take off b/l mittens. She has advanced dementia, not following commands, minimal language outpt. Still has right gaze and left neglect. As per RN and stepdaughter, pt has one episode of saying "not feeling right" followed by bilateral arm flexion tensing up and hand clenched, while body tremulous, lasting 20 secs, 5 secs and 5 secs each in a short time. No LOC or unresponsiveness. EEG ordered.   OBJECTIVE Vitals:   08/31/19 2330 09/01/19 0130 09/01/19 0356 09/01/19 0820  BP: (!) 147/80 (!) 184/70 (!) 190/74 (!) 187/74  Pulse: 65 65 (!) 59 62  Resp: 16 16 16 16   Temp: 98.4 F (36.9 C) 98.3 F (36.8 C) 97.6 F (36.4 C) 97.6 F (36.4 C)  TempSrc: Axillary Axillary Oral Oral  SpO2: 97% 98% 96% 97%  Weight:   69.1 kg   Height:   5\' 1"  (1.549 m)     CBC:  Recent Labs  Lab 08/30/19 1600 08/30/19 1600 08/31/19 1036 09/01/19 0416  WBC 13.2*   < > 7.1 6.5  NEUTROABS 11.9*  --  5.4  --   HGB 17.1*   < > 14.0 14.5  HCT 52.0*   < > 42.9 44.0  MCV 90.8   < > 92.1 89.4  PLT 342   < > 272 286   < > = values in this interval not displayed.    Basic Metabolic Panel:  Recent Labs  Lab 08/31/19 1036 09/01/19 0416  NA 138 138  K 3.5 3.5  CL 107 103  CO2 22 24  GLUCOSE 100* 96  BUN 17 14  CREATININE 0.65 0.95  CALCIUM 8.4* 9.1  MG  --  1.8    Lipid Panel:     Component Value Date/Time   CHOL 178 08/31/2019 0605   TRIG 96 08/31/2019 0605   HDL 45 08/31/2019 0605   CHOLHDL 4.0 08/31/2019 0605   VLDL 19 08/31/2019 0605   LDLCALC 114 (H) 08/31/2019 0605   HgbA1c:  Lab Results  Component Value Date   HGBA1C 5.8 (H) 08/31/2019   Urine Drug Screen: No results found for: LABOPIA, COCAINSCRNUR, LABBENZ, AMPHETMU, THCU, LABBARB  Alcohol Level No results found for: ETH  IMAGING  CT ANGIO HEAD W OR WO CONTRAST CT ANGIO NECK W OR WO  CONTRAST 08/30/2019 IMPRESSION:  1. No acute intracranial abnormality.  2. Cerebral atrophy and microvascular disease changes.  3. Chronic bilateral basal ganglia lacunar infarcts.  4. No hemodynamically significant stenosis involving either carotid system. Marked severity calcification at the origin of the left vertebral artery and distal aspect of the right vertebral artery.  5. Mild to moderate severity calcification of the bilateral cavernous carotid arteries without evidence of intracranial aneurysm or hemodynamically significant stenosis.  Aortic Atherosclerosis (ICD10-I70.0).   DG Chest 1 View 08/30/2019 IMPRESSION:  Focal density in the right hilar region which may reflect overlying vascular structures. Enlarged hilar lymph node, airspace opacity, or pulmonary nodule at this site is not excluded. Contrast enhanced CT of the chest could be performed for further evaluation.   CT Head Wo Contrast 08/30/2019 IMPRESSION:  1. Extensive chronic small vessel ischemic change of the white matter. More focal hypodensity within the right posterior subcortical white matter may reflect additional change of small-vessel ischemia however age indeterminate white matter infarct cannot be excluded.  2. Atrophy. Generalized ventricular enlargement somewhat out of proportion  to atrophy, possible normal pressure hydrocephalus.   CT CHEST W CONTRAST 08/30/2019 IMPRESSION:  1. Very mild bilateral lower lobe atelectasis.  2. Small hiatal hernia.  3. Findings likely consistent with a small hepatic cyst versus hemangioma.  Aortic Atherosclerosis (ICD10-I70.0).   MR BRAIN WO CONTRAST 08/30/2019 IMPRESSION:  1. Multiple areas of acute to early subacute ischemia within the right MCA territory. Varying signal intensity may indicate multiple events.  2. Focus of magnetic susceptibility effect within the proximal M2 segment of the right middle cerebral artery, possibly indicating the site of thrombus.  3.  Findings of chronic ischemic microangiopathy and generalized atrophy.   ECHOCARDIOGRAM COMPLETE 08/31/2019 IMPRESSIONS   1. Left ventricular ejection fraction, by estimation, is 60 to 65%. The left ventricle has normal function. The left ventricle has no regional wall motion abnormalities. There is mild left ventricular hypertrophy. Left ventricular diastolic parameters are consistent with Grade I diastolic dysfunction (impaired relaxation).   2. Right ventricular systolic function is normal. The right ventricular size is normal. There is mildly elevated pulmonary artery systolic pressure.   3. Not well seen possible PFO suggest TEE/Bubble study if clinically appropriate.   4. The mitral valve is normal in structure. Trivial mitral valve regurgitation. No evidence of mitral stenosis.   5. The aortic valve is tricuspid. Aortic valve regurgitation is mild. Moderate sclerosis without stenosis.   6. The inferior vena cava is normal in size with greater than 50% respiratory variability, suggesting right atrial pressure of 3 mmHg.   VAS US CAROTID (at [facility] and [facility] only) 08/31/2019 Summary:  Right Carotid: Velocities in the right ICA are consistent with a 1-39% stenosis.  Left Carotid: Velocities in the left ICA are consistent with a 1-39% stenosis.  Vertebrals: Bilateral vertebral arteries demonstrate antegrade flow.   ECG - SR rate 80 BPM. (See cardiology reading for complete details)   PHYSICAL EXAM  Temp:  [97.5 F (36.4 C)-98.6 F (37 C)] 97.6 F (36.4 C) (05/01 1425) Pulse Rate:  [59-80] 80 (05/01 1220) Resp:  [14-16] 16 (05/01 1220) BP: (147-190)/(70-105) 178/74 (05/01 1425) SpO2:  [93 %-100 %] 95 % (05/01 1425) Weight:  [69.1 kg] 69.1 kg (05/01 0356)  General - Well nourished, well developed, eyes open, not following commands.  Ophthalmologic - fundi not visualized due to noncooperation.  Cardiovascular - Regular rate and rhythm.  Neuro - awake, eyes open, not following commands,  minimal language output, not answer orientation questions, naming or repeating but able to say spontaneously "can you handle this" for her mitten bilaterally. PERRL, right gaze preference, not cross midline, not blinking to visual threat on the left. Mild left facial droop. Moving all extremities equally, 4/5 BUEs and 3/5 BLEs. Withdraw to pain bilaterally, coordination not cooperative and gait not tested.   ASSESSMENT/PLAN Rachel Mcguire is a 86 y.o. female with history of carotid stenosis status post left endarterectomy, hyperlipidemia, hypertension, dementia presenting with progressively worsening lethargy, confusion and poor oral intake. She did not receive IV t-PA due to late presentation (>4.5 hours from time of onset)  Stroke:  Multiple areas of acute to early subacute ischemia within the right MCA territory - most likely large vessel disease. cardioembolic stroke can not be rule out completely  CT head -  Extensive chronic small vessel ischemic change of the white matter.    MRI head - Multiple areas of acute to early subacute ischemia within the right MCA territory. Varying signal intensity may indicate multiple events.   CTA H&N -  multifocal intracranial stenosis more prominent at b/l M1/M2, b/ ACAs as well as extracranial stenosis with b/l VA origin high gradestenosis and chronic right ICA bulb athero  Carotid Doppler - unremarkable  2D Echo - EF 60 - 65%.   EEG pending B/l LE Venous Dopplers - no DVT Recommend 30 day cardiac event monitoring as outpt  [employer] Virus 2 - negative  LDL - 114  HgbA1c - 5.8  VTE prophylaxis - Lovenox  aspirin 325 mg daily prior to admission, now on aspirin 81 mg daily and clopidogrel 75 mg daily  Patient counseled to be compliant with her antithrombotic medications  Ongoing aggressive stroke risk factor management  Therapy recommendations:  pending  Disposition:  Pending  Hypertension  Home BP meds: Norvasc  Current BP  meds: Vasotec  Systolic blood pressure somewhat high at times but within parameters . Permissive hypertension (OK if < 220/120) but gradually normalize in 5-7 days  . Long-term BP goal 130-150 given severe intracranial stenosis  Hyperlipidemia  Home Lipid lowering medication: none  LDL 114, goal < 70  Current lipid lowering medication: Lipitor 40 mg daily   Continue statin at discharge  Dementia   CTA concerning for vascular dementia  Clinically consistent with advanced dementia  Living in SNF memory unit  On aricept, continue on discharge  Mildly restless  Other Stroke Risk Factors  Advanced age  Chronic bilateral basal ganglia lacunar infarcts by CT  Family hx stroke (father)  Hx of carotid artery disease s/p CEA left  Other Active Problems  Code status - DNR  UTI - UA WBC 11-20, on Rocephin  Hospital day # 2  Rosalin Hawking, MD PhD Stroke Neurology 09/01/2019 3:00 PM    To contact Stroke Continuity provider, please refer to [email or web address] After hours, contact General Neurology

## 2019-09-01 NOTE — Progress Notes (Signed)
OT Cancellation Note  Patient Details Name: Rachel Mcguire MRN: [id number] DOB: 13-Sep-1931   Cancelled Treatment:    Reason Eval/Treat Not Completed: Active bedrest order Bedrest order entered after activity order. Patient's SBP>180. Will attempt to see later today if bedrest discontinued and BP parameters are met.   Zenovia Jarred, MSOT, OTR/L Acute Rehabilitation Services [facility] Office Number: [phone] Pager: [phone]  Zenovia Jarred 09/01/2019, 9:37 AM

## 2019-09-01 NOTE — Progress Notes (Signed)
MEWS green, protocol for yellow MEWS at 1500 followed.

## 2019-09-01 NOTE — Progress Notes (Signed)
Pt Yellow MEWS 2, B/P 208/83.  Pt had episode of tremors/? Seizure activity and VS taken post episode with elevated B/P.  Provider placed antihypertensive medications (amlodipine) but endorses permissive hypertension for post CVA patient.  Notification of MEWS yellow VS reported to both the attending physician Dr. Florencia Reasons, MD and Neuro hospitalist Dr. Allie Bossier, MD.  Continue to monitor.

## 2019-09-01 NOTE — Progress Notes (Signed)
  Speech Language Pathology Treatment: Dysphagia  Patient Details Name: Rachel Mcguire MRN: [id number] DOB: 1931-05-25 Today's Date: 09/01/2019 Time:  -     Assessment / Plan / Recommendation Clinical Impression  Patient observed with purees and crushed medication with RN, also nectar thickened juice by tsp. Pt continues to have anterior spillage on left side. She has slow bolus manipulation but laryngeal movement is appreciated and her swallow is audible. Throat clear 2/15 swallows. Patient only took 2 sips of liquid with step daughter this morning. Recommend continuing current diet and receive a MBS before advancing diet.    HPI HPI: 86 year old female with past medical history of carotid stenosis status post left endarterectomy, hyperlipidemia, hypertension, dementia who presents from [facility] skilled nursing facility with a several day history of progressively worsening lethargy, confusion and poor oral intake. MRI = acute-early subacute ischemia Right MCA territory. ChestCT = mild BLL atelectasis      SLP Plan   Diet tolerance, MBS            Rachel Mcguire., MA CCC-SLP 09/01/2019, 11:26 AM

## 2019-09-01 NOTE — Progress Notes (Addendum)
Patient has 3 episodes of tremors and rigidity with first episode occurring at ~1410, witnessed by daughter, lasting ~1 min, with jaw clenched and rigidity of hands and shaking of whole body.  Second episode occurred ~1417, when I was paged to come to the room. This second episode resolved before I arrived bedside, per daughter lasted ~20 sec also with rigidity, clenched hands and shaking of legs and feet.  Upon nurse assessment, patient was alert and responsive. A third episode of tremors of hands and feet occurred while I was bedside lasting ~20 sec, upon which patient was alert and responsive. No loss of consciousness, or rolling back of eyes reported by daughter.  Vital signed obtained at 1425 were consistent with VS from earlier today.  Notified both attending physician Dr. Florencia Reasons, MD and Neuro hospitalist Dr. Rosalin Hawking, MD of events.  Patient currently stable, will continue to monitor.

## 2019-09-02 LAB — BASIC METABOLIC PANEL
Anion gap: 8 (ref 5–15)
BUN: 20 mg/dL (ref 8–23)
CO2: 24 mmol/L (ref 22–32)
Calcium: 8.9 mg/dL (ref 8.9–10.3)
Chloride: 107 mmol/L (ref 98–111)
Creatinine, Ser: 0.87 mg/dL (ref 0.44–1.00)
GFR calc Af Amer: >60 mL/min (ref >60)
GFR calc non Af Amer: >60 mL/min (ref >60)
Glucose, Bld: 106 mg/dL — ABNORMAL HIGH (ref 70–99)
Potassium: 4 mmol/L (ref 3.5–5.1)
Sodium: 139 mmol/L (ref 135–145)

## 2019-09-02 LAB — TSH: TSH: 2.228 u[IU]/mL (ref 0.350–4.500)

## 2019-09-02 LAB — MAGNESIUM: Magnesium: 1.9 mg/dL (ref 1.7–2.4)

## 2019-09-02 MED ORDER — AMLODIPINE BESYLATE 10 MG PO TABS
10.0000 mg | ORAL_TABLET | Freq: Every day | ORAL | Status: DC
  Administered 2019-09-03: 10 mg via ORAL
  Filled 2019-09-02: qty 1

## 2019-09-02 NOTE — Progress Notes (Signed)
STROKE TEAM PROGRESS NOTE   INTERVAL HISTORY Son [place] at bedside.  Patient overnight no acute event, except one similar episode of bilateral upper and lower extremities tremors.  EEG negative for seizure.  Patient lying in bed, still fidgety with bilateral hand mitten, try to take off mittens.  OBJECTIVE Vitals:   09/02/19 0049 09/02/19 0353 09/02/19 0751 09/02/19 1226  BP: (!) 174/79 (!) 159/83 (!) 161/101 (!) 169/93  Pulse: 62 63 63 67  Resp: 20 20 18  (!) 21  Temp: 98.3 F (36.8 C) (!) 97.5 F (36.4 C) (!) 97.4 F (36.3 C) 97.6 F (36.4 C)  TempSrc: Axillary Oral Oral Oral  SpO2: 96% 97% 97% 94%  Weight:      Height:        CBC:  Recent Labs  Lab 08/30/19 1600 08/30/19 1600 08/31/19 1036 09/01/19 0416  WBC 13.2*   < > 7.1 6.5  NEUTROABS 11.9*  --  5.4  --   HGB 17.1*   < > 14.0 14.5  HCT 52.0*   < > 42.9 44.0  MCV 90.8   < > 92.1 89.4  PLT 342   < > 272 286   < > = values in this interval not displayed.    Basic Metabolic Panel:  Recent Labs  Lab 09/01/19 0416 09/02/19 0500  NA 138 139  K 3.5 4.0  CL 103 107  CO2 24 24  GLUCOSE 96 106*  BUN 14 20  CREATININE 0.95 0.87  CALCIUM 9.1 8.9  MG 1.8 1.9    Lipid Panel:     Component Value Date/Time   CHOL 178 08/31/2019 0605   TRIG 96 08/31/2019 0605   HDL 45 08/31/2019 0605   CHOLHDL 4.0 08/31/2019 0605   VLDL 19 08/31/2019 0605   LDLCALC 114 (H) 08/31/2019 0605   HgbA1c:  Lab Results  Component Value Date   HGBA1C 5.8 (H) 08/31/2019   Urine Drug Screen: No results found for: LABOPIA, COCAINSCRNUR, LABBENZ, AMPHETMU, THCU, LABBARB  Alcohol Level No results found for: ETH  IMAGING  CT ANGIO HEAD W OR WO CONTRAST CT ANGIO NECK W OR WO CONTRAST 08/30/2019 IMPRESSION:  1. No acute intracranial abnormality.  2. Cerebral atrophy and microvascular disease changes.  3. Chronic bilateral basal ganglia lacunar infarcts.  4. No hemodynamically significant stenosis involving either carotid system.  Marked severity calcification at the origin of the left vertebral artery and distal aspect of the right vertebral artery.  5. Mild to moderate severity calcification of the bilateral cavernous carotid arteries without evidence of intracranial aneurysm or hemodynamically significant stenosis.  Aortic Atherosclerosis (ICD10-I70.0).   DG Chest 1 View 08/30/2019 IMPRESSION:  Focal density in the right hilar region which may reflect overlying vascular structures. Enlarged hilar lymph node, airspace opacity, or pulmonary nodule at this site is not excluded. Contrast enhanced CT of the chest could be performed for further evaluation.   CT Head Wo Contrast 08/30/2019 IMPRESSION:  1. Extensive chronic small vessel ischemic change of the white matter. More focal hypodensity within the right posterior subcortical white matter may reflect additional change of small-vessel ischemia however age indeterminate white matter infarct cannot be excluded.  2. Atrophy. Generalized ventricular enlargement somewhat out of proportion to atrophy, possible normal pressure hydrocephalus.   CT CHEST W CONTRAST 08/30/2019 IMPRESSION:  1. Very mild bilateral lower lobe atelectasis.  2. Small hiatal hernia.  3. Findings likely consistent with a small hepatic cyst versus hemangioma.  Aortic Atherosclerosis (ICD10-I70.0).   MR  BRAIN WO CONTRAST 08/30/2019 IMPRESSION:  1. Multiple areas of acute to early subacute ischemia within the right MCA territory. Varying signal intensity may indicate multiple events.  2. Focus of magnetic susceptibility effect within the proximal M2 segment of the right middle cerebral artery, possibly indicating the site of thrombus.  3. Findings of chronic ischemic microangiopathy and generalized atrophy.   ECHOCARDIOGRAM COMPLETE 08/31/2019 IMPRESSIONS   1. Left ventricular ejection fraction, by estimation, is 60 to 65%. The left ventricle has normal function. The left ventricle has no regional  wall motion abnormalities. There is mild left ventricular hypertrophy. Left ventricular diastolic parameters are consistent with Grade I diastolic dysfunction (impaired relaxation).   2. Right ventricular systolic function is normal. The right ventricular size is normal. There is mildly elevated pulmonary artery systolic pressure.   3. Not well seen possible PFO suggest TEE/Bubble study if clinically appropriate.   4. The mitral valve is normal in structure. Trivial mitral valve regurgitation. No evidence of mitral stenosis.   5. The aortic valve is tricuspid. Aortic valve regurgitation is mild. Moderate sclerosis without stenosis.   6. The inferior vena cava is normal in size with greater than 50% respiratory variability, suggesting right atrial pressure of 3 mmHg.   VAS US CAROTID (at [facility] and [facility] only) 08/31/2019 Summary:  Right Carotid: Velocities in the right ICA are consistent with a 1-39% stenosis.  Left Carotid: Velocities in the left ICA are consistent with a 1-39% stenosis.  Vertebrals: Bilateral vertebral arteries demonstrate antegrade flow.   ECG - SR rate 80 BPM. (See cardiology reading for complete details)  EEG  09/01/2019 IMPRESSION: This is an abnormal EEG secondary to general background slowing.  This finding may be seen with a diffuse disturbance that is etiologically nonspecific, but may include a metabolic encephalopathy, among other possibilities. No epileptiform activity was noted.      PHYSICAL EXAM   Temp:  [97.4 F (36.3 C)-99.2 F (37.3 C)] 97.6 F (36.4 C) (05/02 1226) Pulse Rate:  [62-90] 67 (05/02 1226) Resp:  [16-21] 21 (05/02 1226) BP: (132-208)/(74-105) 169/93 (05/02 1226) SpO2:  [93 %-98 %] 94 % (05/02 1226)  General - Well nourished, well developed, eyes open, not following commands.  Ophthalmologic - fundi not visualized due to noncooperation.  Cardiovascular - Regular rate and rhythm.  Neuro - awake, eyes open, not following commands, minimal  language output, not answer orientation questions, naming or repeating. PERRL, right gaze preference, not cross midline, not blinking to visual threat on the left. Mild left facial droop. Moving all extremities equally, 4/5 BUEs and 3/5 BLEs. Withdraw to pain bilaterally, coordination not cooperative and gait not tested.   ASSESSMENT/PLAN Ms. KEELYNN ZETINA is a 86 y.o. female with history of carotid stenosis status post left endarterectomy, hyperlipidemia, hypertension, dementia presenting with progressively worsening lethargy, confusion and poor oral intake. She did not receive IV t-PA due to late presentation (>4.5 hours from time of onset)  Stroke:  Multiple areas of acute to early subacute ischemia within the right MCA territory - most likely large vessel disease. cardioembolic stroke can not be rule out completely  CT head -  Extensive chronic small vessel ischemic change of the white matter.    MRI head - Multiple areas of acute to early subacute ischemia within the right MCA territory. Varying signal intensity may indicate multiple events.   CTA H&N - multifocal intracranial stenosis more prominent at b/l M1/M2, b/ ACAs as well as extracranial stenosis with b/l VA origin  high gradestenosis and chronic right ICA bulb athero  Carotid Doppler - unremarkable  2D Echo - EF 60 - 65%.   EEG - general background slowing. No epileptiform activity was noted.  B/l LE Venous Dopplers - no DVT Recommend 30 day cardiac event monitoring as outpt if family decided on aggressive management  [employer] Virus 2 - negative  LDL - 114  HgbA1c - 5.8  VTE prophylaxis - Lovenox  aspirin 325 mg daily prior to admission, now on aspirin 81 mg daily and clopidogrel 75 mg daily  Patient counseled to be compliant with her antithrombotic medications  Ongoing aggressive stroke risk factor management  Therapy recommendations:  SNF recommended  Disposition:  Pending  Hypertension  Home BP meds:  Norvasc  Current BP meds: Vasotec  Systolic blood pressure somewhat high at times but within parameters . Permissive hypertension (OK if < 220/120) but gradually normalize in 5-7 days  . Long-term BP goal 130-150 given severe intracranial stenosis  Hyperlipidemia  Home Lipid lowering medication: none  LDL 114, goal < 70  Current lipid lowering medication: Lipitor 40 mg daily   Continue statin at discharge  Dementia   CTA concerning for vascular dementia  Clinically consistent with advanced dementia  Living in SNF memory unit  On aricept, continue on discharge  Mildly restless  Other Stroke Risk Factors  Advanced age  Chronic bilateral basal ganglia lacunar infarcts by CT  Family hx stroke (father)  Hx of carotid artery disease s/p CEA left  Other Active Problems  Code status - DNR  UTI - UA WBC 11-20, on Rocephin  Hospital day # 3  Neurology will sign off. Please call with questions. Pt will follow up with stroke clinic NP at [facility] in about 4 weeks. Thanks for the consult.  Rosalin Hawking, MD PhD Stroke Neurology 09/02/2019 3:32 PM  To contact Stroke Continuity provider, please refer to [email or web address] After hours, contact General Neurology

## 2019-09-02 NOTE — Progress Notes (Signed)
Occupational Therapy Evaluation Patient Details Name: Rachel Mcguire MRN: [id number] DOB: 12-Oct-1931 Today's Date: 09/02/2019    History of Present Illness 86 year old female with past medical history of carotid stenosis status post left endarterectomy, hyperlipidemia, hypertension, dementia who presents from [facility] skilled nursing facility with a several day history of progressively worsening lethargy, confusion and poor oral intake. MRI brain which showed scattered right MCA infarcts, both subacute/acute.  +dehydration; 5/1 multiple episodes of shaking/tremors with EEG non-specific with background slowing (?metabolic encephalopathy)   Clinical Impression   PTA pt resided on a memory care unit at [facility]. PLOF obtained from son at bedside. Per son, pt has had a steady decline in function over the past couple of months requiring assist for self-feeding and minimally able to ambulate. Pt currently requires mod to total assist x 2 for self-care and functional transfer tasks. Pt tolerated sitting EOB 20 min, initially requiring min assist for balance then progressing to total assist due to fatigue. Continual retro lean with pt requiring max multimodal cues to self-correct. Pt engaged in grooming and self-feeding tasks while seated EOB requiring max hand over hand assist to initiate. Pt does not attend to the left, unable to turn eyes or head past midline. Attempted sit/stand with minimal clearance of hips off bed. Pt assisted back to bed and noted to be incontinent of bowel. Total assist x 2 to get cleaned up in supine. Pt demonstrates decreased ROM, strength, endurance, balance, sitting/standing tolerance, activity tolerance, and safety awareness impacting ability to complete self-care and functional transfer tasks. Recommend skilled OT services to address above deficits in order to promote function and prevent further decline.    Follow Up Recommendations  SNF;Supervision/Assistance - 24 hour     Equipment Recommendations  Other (comment)(TBD at next venue of care)    Recommendations for Other Services       Precautions / Restrictions Precautions Precautions: Fall Precaution Comments: son reports 3 falls in past 6 months (most of them recently) Restrictions Weight Bearing Restrictions: No      Mobility Bed Mobility Overal bed mobility: Needs Assistance Bed Mobility: Rolling;Supine to Sit;Sit to Supine Rolling: Total assist;+2 for physical assistance   Supine to sit: Total assist;+2 for physical assistance Sit to supine: Total assist;+2 for physical assistance   General bed mobility comments: Despite max multimodal cues, pt unable to follow one step instructions.   Transfers Overall transfer level: Needs assistance Equipment used: (bed pad under pelvis) Transfers: Lateral/Scoot Transfers          Lateral/Scoot Transfers: +2 physical assistance;Max assist General transfer comment: Pt able to grab and hold onto therapists. Attempted sit/stand with no success. Pt able to laterally scoot up towards the [facility] with max assist x 2.     Balance Overall balance assessment: Needs assistance Sitting-balance support: No upper extremity supported;Feet supported Sitting balance-Leahy Scale: Poor Sitting balance - Comments: Pt tolerated sitting EOB 20 min. Initially requiring min assist for balance, then progressing to total assist due to fatigue. Continual retro lean.  Postural control: Posterior lean                                 ADL either performed or assessed with clinical judgement   ADL Overall ADL's : Needs assistance/impaired Eating/Feeding: Moderate assistance;Sitting Eating/Feeding Details (indicate cue type and reason): While seated EOB. Max assist to maintain seated balance. Max hand over hand assist to reach for utensils/drink and initiate  task. Once initiated pt could bring food to mouth with min assist. Pocketing on left side noted.   Grooming: Maximal assistance;Brushing hair;Sitting Grooming Details (indicate cue type and reason): While seated EOB. Max hand over hand assist to initiate and bring brush to hair. Once initated pt able to brush right side of head. Pt unable to assist with brushing left side of head despite max multimodal cues and hand over hand assist.  Upper Body Bathing: Maximal assistance;Sitting   Lower Body Bathing: Total assistance;+2 for physical assistance;+2 for safety/equipment;Bed level   Upper Body Dressing : Maximal assistance;Sitting   Lower Body Dressing: Total assistance;+2 for physical assistance;Bed level;+2 for safety/equipment   Toilet Transfer: Total assistance(bed pan)   Toileting- Clothing Manipulation and Hygiene: Total assistance;+2 for physical assistance;+2 for safety/equipment;Bed level Toileting - Clothing Manipulation Details (indicate cue type and reason): Pt noted to be incontinent of bowel, requiring total assist to roll and get cleaned up in bed.      Functional mobility during ADLs: (Deferred this date.) General ADL Comments: Pt tolerated sitting EOB 20 min with variable min to total assist for balance.      Vision   Additional Comments: Pt does not attend to left side despite max multimodal cues.      Perception     Praxis      Pertinent Vitals/Pain Pain Assessment: Faces Faces Pain Scale: No hurt     Hand Dominance Right   Extremity/Trunk Assessment Upper Extremity Assessment Upper Extremity Assessment: Difficult to assess due to impaired cognition;Generalized weakness(Pt resistant to ROM assessment)   Lower Extremity Assessment Lower Extremity Assessment: Defer to PT evaluation   Cervical / Trunk Assessment Cervical / Trunk Assessment: Kyphotic;Other exceptions Cervical / Trunk Exceptions: would not turn head or eyes past midline towards her left (even resisted with assist)   Communication Communication Communication: Expressive difficulties    Cognition Arousal/Alertness: Awake/alert Behavior During Therapy: Restless(Occasionally restless and grabbing at lines) Overall Cognitive Status: Difficult to assess                                 General Comments: history of dementia and was in a dementia care unit   General Comments  Pt's son Nicki Reaper present for session    Exercises     Shoulder Instructions      Home Living Family/patient expects to be discharged to:: Skilled nursing facility                                 Additional Comments: [facility]      Prior Functioning/Environment Level of Independence: Needs assistance  Gait / Transfers Assistance Needed: per son, as  of March 2020 was walking with walker and son was able to take her off site but would fatigue; then COVID; got some therapy 3x/wk for 1 month; then had a "buddy" that was to walk with her every day; last week moved to skilled care area due to quick downturn (left side drooping) ADL's / Homemaking Assistance Needed: prior to downturn was able to do ADLs with assistance; after downturn has been unable to feed herself Communication / Swallowing Assistance Needed: as cognition declined, speech declined; not HOH as far as son knows          OT Problem List: Decreased strength;Decreased range of motion;Decreased activity tolerance;Impaired balance (sitting and/or standing);Decreased cognition;Decreased safety awareness  OT Treatment/Interventions: Self-care/ADL training;Therapeutic exercise;Neuromuscular education;Energy conservation;DME and/or AE instruction;Therapeutic activities;Patient/family education;Balance training    OT Goals(Current goals can be found in the care plan section) Acute Rehab OT Goals Patient Stated Goal: unable; Son wants pt to regain as much health and independence as possible Time For Goal Achievement: 09/16/19 Potential to Achieve Goals: Fair ADL Goals Pt Will Perform Eating: with min  assist;sitting Pt Will Perform Grooming: with min assist;sitting Pt Will Transfer to Toilet: with mod assist;with +2 assist;bedside commode Additional ADL Goal #1: Pt to tolerate sitting EOB 10 min with min guard, in preparation for ADLs and functional transfers.  OT Frequency: Min 2X/week   Barriers to D/C:            Co-evaluation PT/OT/SLP Co-Evaluation/Treatment: Yes Reason for Co-Treatment: Necessary to address cognition/behavior during functional activity;For patient/therapist safety;To address functional/ADL transfers   OT goals addressed during session: ADL's and self-care;Strengthening/ROM      AM-PAC OT "6 Clicks" Daily Activity     Outcome Measure Help from another person eating meals?: A Lot Help from another person taking care of personal grooming?: A Lot Help from another person toileting, which includes using toliet, bedpan, or urinal?: Total Help from another person bathing (including washing, rinsing, drying)?: Total Help from another person to put on and taking off regular upper body clothing?: A Lot Help from another person to put on and taking off regular lower body clothing?: Total 6 Click Score: 9   End of Session Nurse Communication: Mobility status  Activity Tolerance: Patient limited by fatigue;Other (comment)(Limited by cognitive deficits at baseline) Patient left: in bed;with call bell/phone within reach;with bed alarm set;with nursing/sitter in room;with family/visitor present  OT Visit Diagnosis: Unsteadiness on feet (R26.81);Repeated falls (R29.6);Muscle weakness (generalized) (M62.81);Feeding difficulties (R63.3)                Time: [id number] OT Time Calculation (min): 55 min Charges:  OT General Charges $OT Visit: 1 Visit OT Evaluation $OT Eval Moderate Complexity: 1 Mod OT Treatments $Self Care/Home Management : 8-22 mins  Mauri Brooklyn OTR/L [phone]  Mauri Brooklyn 09/02/2019, 11:04 AM

## 2019-09-02 NOTE — Progress Notes (Signed)
PROGRESS NOTE  Rachel Mcguire:[id number] DOB: 12-12-1931 DOA: 08/30/2019 PCP: Lajean Manes, MD  Brief summary:  86 year old female with past medical history of carotid stenosis status post left endarterectomy, hyperlipidemia, hypertension, dementia who presents from [facility] skilled nursing facility with a several day history of progressively worsening lethargy, confusion and poor oral intake.  HPI/Recap of past 24 hours:  She is awake, occasionally talk a few words, only oriented to self Family at bedside  Assessment/Plan: Principal Problem:   Stroke due to occlusion of right middle cerebral artery ([name]) Active Problems:   Hypertensive urgency   Dementia without behavioral disturbance ([place])   Acute metabolic encephalopathy   Dehydration   Leukocytosis   Acute CVA (cerebrovascular accident) ([place])  CVA: Multiple areas of acute to early subacute ischemia within the right MCA territory - most likely large vessel disease. cardioembolic stroke can not be rule out completely Currently on aspirin /Plavix and statin,, allow permissive hypertension, gradually restart home medication Norvasc PT/OT/speech eval, currently on dysphagia 1 diet, may need modified barium swallow  Neurology input appreciated, neurology recommended 30-day event monitor family does not desire aggressive intervention.  State patient will not allow monitor to stay on her. Advised palliative care following at skilled nursing facility, family aware.    Jerky movement on 5/1  EEG no epileptic spikes . Prn ativan for agitation No jerky movement observed on 5/2   Acute metabolic encephalopathy with underlying advanced dementia (only oriented to self at baseline) Acute worsening of mental status likely due to combination of CVA and UTI Appear more alert today, start to say a few words intermittently  UTI, urine culture grew Klebsiella pneumoniae,  leukocytosis normalized on Rocephin  continue Rocephin  awaiting repeat speech eval, then transition to oral keflex to finish total of 5 days abx treatment  Hypokalemia and hypomagnesemia Replaced, improved  Hypertension; gradually restart Norvasc  Body mass index is 28.78 kg/m.   DVT Prophylaxis: Lovenox  Code Status: DNR  Family Communication: Family at bedside daily  Disposition Plan:    Patient came from:              [facility]                                                                                             Anticipated d/c place:  Will need skilled level care at [facility]  Barriers to d/c OR conditions which need to be met to effect a safe d/c:  may need modified barium study per speech, likely able to discharge on Monday, will need SNF level care at [facility]   Consultants:  Neurology  Procedures:  EEG  Antibiotics:  Rocephin   Objective: BP (!) 159/83 (BP Location: Right Arm)   Pulse 63   Temp (!) 97.5 F (36.4 C) (Oral)   Resp 20   Ht '5\' 1"'  (1.549 m)   Wt 69.1 kg   SpO2 97%   BMI 28.78 kg/m   Intake/Output Summary (Last 24 hours) at 09/02/2019 0701 Last data filed at 09/02/2019 0500 Gross per 24 hour  Intake 160 ml  Output 700 ml  Net -540 ml   Filed Weights   09/01/19 0356  Weight: 69.1 kg    Exam: Patient is examined daily including today on 09/02/2019, exams remain the same as of yesterday except that has changed    General: More alert, say a few words, does not follow command  Cardiovascular: RRR  Respiratory: CTABL  Abdomen: Soft/ND/NT, positive BS  Musculoskeletal: No Edema  Neuro: More alert, say a few words, does not follow command, moving all extremities spontaneously  Data Reviewed: Basic Metabolic Panel: Recent Labs  Lab 08/30/19 1600 08/31/19 1036 09/01/19 0416 09/02/19 0500  NA 140 138 138 139  K 4.1 3.5 3.5 4.0  CL 102 107 103 107  CO2 '25 22 24 24  ' GLUCOSE 123* 100* 96 106*  BUN 33* '17 14 20  ' CREATININE 0.94 0.65 0.95 0.87  CALCIUM 9.9 8.4* 9.1 8.9   MG  --   --  1.8 1.9   Liver Function Tests: Recent Labs  Lab 08/30/19 1600  AST 25  ALT 21  ALKPHOS 87  BILITOT 0.9  PROT 8.0  ALBUMIN 4.6   No results for input(s): LIPASE, AMYLASE in the last 168 hours. No results for input(s): AMMONIA in the last 168 hours. CBC: Recent Labs  Lab 08/30/19 1600 08/31/19 1036 09/01/19 0416  WBC 13.2* 7.1 6.5  NEUTROABS 11.9* 5.4  --   HGB 17.1* 14.0 14.5  HCT 52.0* 42.9 44.0  MCV 90.8 92.1 89.4  PLT 342 272 286   Cardiac Enzymes:   No results for input(s): CKTOTAL, CKMB, CKMBINDEX, TROPONINI in the last 168 hours. BNP (last 3 results) No results for input(s): BNP in the last 8760 hours.  ProBNP (last 3 results) No results for input(s): PROBNP in the last 8760 hours.  CBG: No results for input(s): GLUCAP in the last 168 hours.  Recent Results (from the past 240 hour(s))  Respiratory Panel by RT PCR (Flu A&B, Covid) - Nasopharyngeal Swab     Status: None   Collection Time: 08/30/19  8:29 PM   Specimen: Nasopharyngeal Swab  Result Value Ref Range Status   SARS Coronavirus 2 by RT PCR NEGATIVE NEGATIVE Final    Comment: (NOTE) SARS-CoV-2 target nucleic acids are NOT DETECTED. The SARS-CoV-2 RNA is generally detectable in upper respiratoy specimens during the acute phase of infection. The lowest concentration of SARS-CoV-2 viral copies this assay can detect is 131 copies/mL. A negative result does not preclude SARS-Cov-2 infection and should not be used as the sole basis for treatment or other patient management decisions. A negative result may occur with  improper specimen collection/handling, submission of specimen other than nasopharyngeal swab, presence of viral mutation(s) within the areas targeted by this assay, and inadequate number of viral copies (<131 copies/mL). A negative result must be combined with clinical observations, patient history, and epidemiological information. The expected result is Negative. Fact Sheet  for Patients:  PinkCheek.be Fact Sheet for Healthcare Providers:  GravelBags.it This test is not yet ap proved or cleared by the [place] FDA and  has been authorized for detection and/or diagnosis of SARS-CoV-2 by FDA under an Emergency Use Authorization (EUA). This EUA will remain  in effect (meaning this test can be used) for the duration of the COVID-19 declaration under Section 564(b)(1) of the Act, 21 U.S.C. section 360bbb-3(b)(1), unless the authorization is terminated or revoked sooner.    Influenza A by PCR NEGATIVE NEGATIVE Final   Influenza B by PCR NEGATIVE NEGATIVE Final    Comment: (  NOTE) The Xpert Xpress SARS-CoV-2/FLU/RSV assay is intended as an aid in  the diagnosis of influenza from Nasopharyngeal swab specimens and  should not be used as a sole basis for treatment. Nasal washings and  aspirates are unacceptable for Xpert Xpress SARS-CoV-2/FLU/RSV  testing. Fact Sheet for Patients: PinkCheek.be Fact Sheet for Healthcare Providers: GravelBags.it This test is not yet approved or cleared by the [place] FDA and  has been authorized for detection and/or diagnosis of SARS-CoV-2 by  FDA under an Emergency Use Authorization (EUA). This EUA will remain  in effect (meaning this test can be used) for the duration of the  Covid-19 declaration under Section 564(b)(1) of the Act, 21  U.S.C. section 360bbb-3(b)(1), unless the authorization is  terminated or revoked. Performed at [facility], [place] [address], [place], [place] [address]   Culture, Urine     Status: Abnormal   Collection Time: 08/30/19  9:44 PM   Specimen: Urine, Clean Catch  Result Value Ref Range Status   Specimen Description   Final    URINE, CLEAN CATCH Performed at [facility], [place] [address] [place], [place] [address]    Special Requests    Final    NONE Performed at [facility], [place] [address], [place], [place] [address]    Culture 90,000 COLONIES/mL KLEBSIELLA PNEUMONIAE (A)  Final   Report Status 09/01/2019 FINAL  Final   Organism ID, Bacteria KLEBSIELLA PNEUMONIAE (A)  Final      Susceptibility   Klebsiella pneumoniae - MIC*    AMPICILLIN RESISTANT Resistant     CEFAZOLIN <=4 SENSITIVE Sensitive     CEFTRIAXONE <=1 SENSITIVE Sensitive     CIPROFLOXACIN <=0.25 SENSITIVE Sensitive     GENTAMICIN <=1 SENSITIVE Sensitive     IMIPENEM <=0.25 SENSITIVE Sensitive     NITROFURANTOIN <=16 SENSITIVE Sensitive     TRIMETH/SULFA <=20 SENSITIVE Sensitive     AMPICILLIN/SULBACTAM <=2 SENSITIVE Sensitive     PIP/TAZO <=4 SENSITIVE Sensitive     * 90,000 COLONIES/mL KLEBSIELLA PNEUMONIAE     Studies: EEG  Result Date: 09/01/2019 Alexis Goodell, MD     09/01/2019  4:35 PM ELECTROENCEPHALOGRAM REPORT Patient: GRACIA SAGGESE       Room #: 9U76L EEG No. ID: [id number] Age: 86 y.o.        Sex: female Requesting Physician: Erlinda Hong Report Date:  09/01/2019       Interpreting Physician: Alexis Goodell History: DAKIYAH HEINKE is an 86 y.o. female with altered mental status Medications: Norvasc, Rocephin, Lipitor, Magnesium Conditions of Recording:  This is a 21 channel routine scalp EEG performed with bipolar and monopolar montages arranged in accordance to the international 10/20 system of electrode placement. One channel was dedicated to EKG recording. The patient is in the awake state. Description:  The waking background activity is poorly organized.  It consists of a low voltage mixture of frequencies with theta and delta rhythms being most predominant.  This background activity is continuous and diffusely distributed.  No epileptiform activity is noted.  Hyperventilation and intermittent photic stimulation were not performed. IMPRESSION: This is an abnormal EEG secondary to general background slowing.  This finding may be seen  with a diffuse disturbance that is etiologically nonspecific, but may include a metabolic encephalopathy, among other possibilities.  No epileptiform activity was noted.  Alexis Goodell, MD Neurology [phone] 09/01/2019, 4:31 PM   VAS [place] LOWER EXTREMITY VENOUS (DVT)  Result Date: 09/01/2019  Lower Venous DVTStudy  Indications: Stroke.  Risk Factors: None identified. Limitations: Body habitus, poor ultrasound/tissue interface and patient positioning, patient movement, poor patient cooperation. Comparison Study: No prior studies. Performing Technologist: Oliver Hum RVT  Examination Guidelines: A complete evaluation includes B-mode imaging, spectral Doppler, color Doppler, and power Doppler as needed of all accessible portions of each vessel. Bilateral testing is considered an integral part of a complete examination. Limited examinations for reoccurring indications may be performed as noted. The reflux portion of the exam is performed with the patient in reverse Trendelenburg.  +---------+---------------+---------+-----------+----------+--------------+ RIGHT    CompressibilityPhasicitySpontaneityPropertiesThrombus Aging +---------+---------------+---------+-----------+----------+--------------+ CFV      Full           Yes      Yes                                 +---------+---------------+---------+-----------+----------+--------------+ SFJ      Full                                                        +---------+---------------+---------+-----------+----------+--------------+ FV Prox  Full                                                        +---------+---------------+---------+-----------+----------+--------------+ FV Mid   Full                                                        +---------+---------------+---------+-----------+----------+--------------+ FV DistalFull                                                         +---------+---------------+---------+-----------+----------+--------------+ PFV      Full                                                        +---------+---------------+---------+-----------+----------+--------------+ POP                                                   Not visualized +---------+---------------+---------+-----------+----------+--------------+ PTV      Full                                                        +---------+---------------+---------+-----------+----------+--------------+ PERO  Not visualized +---------+---------------+---------+-----------+----------+--------------+   +---------+---------------+---------+-----------+----------+--------------+ LEFT     CompressibilityPhasicitySpontaneityPropertiesThrombus Aging +---------+---------------+---------+-----------+----------+--------------+ CFV      Full           Yes      Yes                                 +---------+---------------+---------+-----------+----------+--------------+ SFJ      Full                                                        +---------+---------------+---------+-----------+----------+--------------+ FV Prox  Full                                                        +---------+---------------+---------+-----------+----------+--------------+ FV Mid   Full                                                        +---------+---------------+---------+-----------+----------+--------------+ FV DistalFull                                                        +---------+---------------+---------+-----------+----------+--------------+ PFV      Full                                                        +---------+---------------+---------+-----------+----------+--------------+ POP      Full           Yes      Yes                                  +---------+---------------+---------+-----------+----------+--------------+ PTV      Full                                                        +---------+---------------+---------+-----------+----------+--------------+ PERO                                                  Not visualized +---------+---------------+---------+-----------+----------+--------------+     Summary: RIGHT: - There is no evidence of deep vein thrombosis in the lower extremity. However, portions of this examination were limited- see technologist comments above.  - No cystic structure found in the popliteal fossa.  LEFT: - There is no evidence of deep vein  thrombosis in the lower extremity. However, portions of this examination were limited- see technologist comments above.  - No cystic structure found in the popliteal fossa.  *See table(s) above for measurements and observations. Electronically signed by Monica Martinez MD on 09/01/2019 at 12:58:01 PM.    Final     Scheduled Meds: . amLODipine  5 mg Oral Daily  . aspirin EC  325 mg Oral Daily  . atorvastatin  40 mg Oral Daily  . clopidogrel  75 mg Oral Daily  . enoxaparin (LOVENOX) injection  40 mg Subcutaneous Q24H  . sodium chloride flush  10-40 mL Intracatheter Q12H    Continuous Infusions: . cefTRIAXone (ROCEPHIN)  IV 1 g (09/01/19 1523)     Time spent: 34mns I have personally reviewed and interpreted on  09/02/2019 daily labs, tele strips, imagings as discussed above under date review session and assessment and plans.  I reviewed all nursing notes, pharmacy notes, consultant notes,  vitals, pertinent old records  I have discussed plan of care as described above with RN , patient and family on 09/02/2019   FFlorencia ReasonsMD, PhD, FACP  [facility]  Available via Epic secure chat 7am-7pm for nonurgent issues Please page for urgent issues, pager number available through [email or web address] .   09/02/2019, 7:01 AM  LOS: 3 days

## 2019-09-02 NOTE — Evaluation (Signed)
Physical Therapy Evaluation Patient Details Name: Rachel Mcguire MRN: [id number] DOB: [birth year] Today's Date: 09/02/2019   History of Present Illness  86 year old female with past medical history of carotid stenosis status post left endarterectomy, hyperlipidemia, hypertension, dementia who presents from [facility] skilled nursing facility with a several day history of progressively worsening lethargy, confusion and poor oral intake. MRI brain which showed scattered right MCA infarcts, both subacute/acute.  +dehydration; 5/1 multiple episodes of shaking/tremors with EEG non-specific with background slowing (?metabolic encephalopathy)  Clinical Impression   Pt admitted with above diagnosis. Patient was residing in memory care unit and ambulatory with walker (per son) until last 1-2 weeks when she had a significant decline and was moved to the skilled care unit. Today she was not following verbal, visual, or tactile cues for mobility, however once seated upright was able to participate in self-care/ADLs with OT. As she fatigued she progressed from mod assist to total assist for sitting balance. Attempts to stand were unsuccessful, however on return to bed she had been incontinent of bowel and bladder and this may have limited her participation in trying to stand. Lengthy discussion with son re: role of therapies with regards to her cognition and he is agreeable to continue with therapies to see if we can tap into some of her more automatic mobility skills (as she was able to do with feeding herself and combing her hair today).  Pt currently with functional limitations due to the deficits listed below (see PT Problem List). Pt will benefit from skilled PT to increase their independence and safety with mobility to allow discharge to the venue listed below.       Follow Up Recommendations SNF    Equipment Recommendations  Other (comment)(TBD at SNF)    Recommendations for Other Services        Precautions / Restrictions Precautions Precautions: Fall Precaution Comments: son reports 3 falls in past 6 months (most of them recently)      Mobility  Bed Mobility Overal bed mobility: Needs Assistance Bed Mobility: Rolling;Supine to Sit;Sit to Supine Rolling: Total assist;+2 for physical assistance   Supine to sit: Total assist;+2 for physical assistance Sit to supine: Total assist;+2 for physical assistance   General bed mobility comments: pt not following gestural cues  Transfers Overall transfer level: Needs assistance Equipment used: (bed pad under pelvis) Transfers: Lateral/Scoot Transfers          Lateral/Scoot Transfers: +2 physical assistance;Max assist General transfer comment: pt held onto therapists' UE and pocket to hold her trunk upright; attempted sit to stand with limited clearance of hips off bed and then did lateral scoot toward [facility]  Ambulation/Gait                Stairs            Wheelchair Mobility    Modified Rankin (Stroke Patients Only) Modified Rankin (Stroke Patients Only) Pre-Morbid Rankin Score: Moderately severe disability Modified Rankin: Severe disability     Balance Overall balance assessment: Needs assistance Sitting-balance support: No upper extremity supported;Feet supported Sitting balance-Leahy Scale: Poor Sitting balance - Comments: at EOB x ~20 minutes working on ADLs; initially min to mod assist for upright posture, as fatigued progressed to total assist Postural control: Posterior lean                                   Pertinent Vitals/Pain Pain Assessment: Faces Faces Pain  Scale: No hurt    Home Living Family/patient expects to be discharged to:: Skilled nursing facility                      Prior Function Level of Independence: Needs assistance   Gait / Transfers Assistance Needed: per son, as  of March 2020 was walking wiht walker and son was able to take her off site but  would fatigue; then COVID; got some therapy 3x/wk for 1 month; then had a "buddy" that was to walk with her every day; last week moved to skilled care area due to quick downturn (left side drooping)  ADL's / Homemaking Assistance Needed: prior to downturn was able to do ADLs with assistance; after downturn has been unable to feed herself        Hand Dominance   Dominant Hand: Right    Extremity/Trunk Assessment   Upper Extremity Assessment Upper Extremity Assessment: Defer to OT evaluation    Lower Extremity Assessment Lower Extremity Assessment: Difficult to assess due to impaired cognition(AAROM WFL; difficult to assess strength due to cognition)    Cervical / Trunk Assessment Cervical / Trunk Assessment: Kyphotic;Other exceptions Cervical / Trunk Exceptions: would not turn head or eyes past midline towards her left (even resisted with assist)  Communication   Communication: Expressive difficulties  Cognition Arousal/Alertness: Awake/alert Behavior During Therapy: WFL for tasks assessed/performed Overall Cognitive Status: Difficult to assess                                 General Comments: history of dementia and was in a dementia care unit      General Comments General comments (skin integrity, edema, etc.): Son, Nicki Reaper, present and able to provide pt's functional history. Prior to [name] he visited her 3-7 days/week and only recently had been able to visit her.     Exercises     Assessment/Plan    PT Assessment Patient needs continued PT services  PT Problem List Decreased strength;Decreased activity tolerance;Decreased balance;Decreased mobility;Decreased cognition;Decreased knowledge of use of DME;Decreased safety awareness;Impaired sensation       PT Treatment Interventions DME instruction;Gait training;Functional mobility training;Therapeutic activities;Balance training;Neuromuscular re-education;Cognitive remediation;Patient/family education     PT Goals (Current goals can be found in the Care Plan section)  Acute Rehab PT Goals Patient Stated Goal: unable; Son wants pt to regain as much health and independence as possible PT Goal Formulation: With family Time For Goal Achievement: 09/16/19 Potential to Achieve Goals: Fair    Frequency Min 2X/week   Barriers to discharge        Co-evaluation               AM-PAC PT "6 Clicks" Mobility  Outcome Measure Help needed turning from your back to your side while in a flat bed without using bedrails?: Total Help needed moving from lying on your back to sitting on the side of a flat bed without using bedrails?: Total Help needed moving to and from a bed to a chair (including a wheelchair)?: Total Help needed standing up from a chair using your arms (e.g., wheelchair or bedside chair)?: Total Help needed to walk in hospital room?: Total Help needed climbing 3-5 steps with a railing? : Total 6 Click Score: 6    End of Session   Activity Tolerance: Patient limited by fatigue Patient left: in bed;with nursing/sitter in room;with restraints reapplied;with SCD's reapplied(NT in to  feed pt remaining breakfast; bil mitts) Nurse Communication: Other (comment)(NT in to assist with cleaning pt after incontinence B/B) PT Visit Diagnosis: Hemiplegia and hemiparesis;History of falling (Z91.81);Muscle weakness (generalized) (M62.81) Hemiplegia - Right/Left: Left Hemiplegia - dominant/non-dominant: Non-dominant Hemiplegia - caused by: Cerebral infarction    TimeOS:[id number] PT Time Calculation (min) (ACUTE ONLY): 55 min   Charges:   PT Evaluation $PT Eval Low Complexity: 1 Low PT Treatments $Neuromuscular Re-education: 8-22 mins         Arby Barrette, PT Pager [phone]   Rexanne Mano 09/02/2019, 10:01 AM

## 2019-09-03 DIAGNOSIS — F0150 Vascular dementia without behavioral disturbance: Secondary | ICD-10-CM

## 2019-09-03 LAB — MAGNESIUM: Magnesium: 1.9 mg/dL (ref 1.7–2.4)

## 2019-09-03 LAB — BASIC METABOLIC PANEL
Anion gap: 12 (ref 5–15)
BUN: 17 mg/dL (ref 8–23)
CO2: 23 mmol/L (ref 22–32)
Calcium: 8.9 mg/dL (ref 8.9–10.3)
Chloride: 104 mmol/L (ref 98–111)
Creatinine, Ser: 0.75 mg/dL (ref 0.44–1.00)
GFR calc Af Amer: >60 mL/min (ref >60)
GFR calc non Af Amer: >60 mL/min (ref >60)
Glucose, Bld: 98 mg/dL (ref 70–99)
Potassium: 3.6 mmol/L (ref 3.5–5.1)
Sodium: 139 mmol/L (ref 135–145)

## 2019-09-03 MED ORDER — CLOPIDOGREL BISULFATE 75 MG PO TABS: 75.0000 mg | ORAL_TABLET | Freq: Every day | ORAL | Status: AC

## 2019-09-03 MED ORDER — ATORVASTATIN CALCIUM 40 MG PO TABS: 40.0000 mg | ORAL_TABLET | Freq: Every day | ORAL | Status: AC

## 2019-09-03 MED ORDER — CLONIDINE HCL 0.1 MG PO TABS
0.2000 mg | ORAL_TABLET | Freq: Once | ORAL | Status: AC
  Administered 2019-09-03: 0.2 mg via ORAL
  Filled 2019-09-03: qty 2

## 2019-09-03 MED ORDER — ACETAMINOPHEN 325 MG PO TABS: 650.0000 mg | ORAL_TABLET | ORAL | Status: AC | PRN

## 2019-09-03 MED ORDER — CEPHALEXIN 500 MG PO CAPS: 500.0000 mg | ORAL_CAPSULE | Freq: Two times a day (BID) | ORAL | Status: DC

## 2019-09-03 MED ORDER — CEPHALEXIN 500 MG PO CAPS
500.0000 mg | ORAL_CAPSULE | Freq: Two times a day (BID) | ORAL | Status: DC
  Administered 2019-09-03 (×2): 500 mg via ORAL
  Filled 2019-09-03 (×2): qty 1

## 2019-09-03 MED ORDER — AMLODIPINE BESYLATE 10 MG PO TABS: 10.0000 mg | ORAL_TABLET | Freq: Every day | ORAL | Status: AC

## 2019-09-03 MED ORDER — ASPIRIN 325 MG PO TBEC: 325.0000 mg | DELAYED_RELEASE_TABLET | Freq: Every day | ORAL | 0 refills | Status: AC

## 2019-09-03 MED ORDER — CEPHALEXIN 500 MG PO CAPS: 500.0000 mg | ORAL_CAPSULE | Freq: Two times a day (BID) | ORAL | Status: AC

## 2019-09-03 NOTE — Progress Notes (Signed)
Called report to facility to [facility].

## 2019-09-03 NOTE — Progress Notes (Signed)
Attempted to call report. No answer.

## 2019-09-03 NOTE — Progress Notes (Signed)
Bedside report received. Patient with slight agitation, daughter in law at bedside. BP elevated 215/110. Patient planned for discharge to SNF. MD secure text and made aware. Received order for one time dose of clonidine. Patient and family updated, will monitor BP. Awaiting transport.

## 2019-09-03 NOTE — NC FL2 (Signed)
[place] LEVEL OF CARE SCREENING TOOL     IDENTIFICATION  Patient Name: Rachel Mcguire Birthdate: [birth year] Sex: female Admission Date (Current Location): 08/30/2019  [facility] and [place] Number:  Herbalist and Address:  [facility] [place], [place] [address], [place], [place] [address]      Provider Number: [id number]  Attending Physician Name and Address:  Cherene Altes, MD  Relative Name and Phone Number:       Current Level of Care: Hospital Recommended Level of Care: [place] Prior Approval Number:    Date Approved/Denied:   PASRR Number:    Discharge Plan: SNF    Current Diagnoses: Patient Active Problem List   Diagnosis Date Noted  . Acute CVA (cerebrovascular accident) ([place])   . Hypertensive urgency 08/30/2019  . Dementia without behavioral disturbance ([place]) 08/30/2019  . Acute metabolic encephalopathy [id number]  . Dehydration 08/30/2019  . Leukocytosis 08/30/2019  . Stroke due to occlusion of right middle cerebral artery ([place]) 08/30/2019  . Closed left hip fracture, initial encounter ([place]) 07/14/2016  . Essential hypertension 07/14/2016  . Closed fracture of neck of left femur ([place])   . Aftercare following surgery of the circulatory system, [place] 11/20/2013  . Occlusion and stenosis of carotid artery without mention of cerebral infarction 03/11/2011    Orientation RESPIRATION BLADDER Height & Weight     Self  Normal Incontinent Weight: 152 lb 5.4 oz (69.1 kg) Height:  5\' 1"  (154.9 cm)  BEHAVIORAL SYMPTOMS/MOOD NEUROLOGICAL BOWEL NUTRITION STATUS      Incontinent Diet(see DC summary)  AMBULATORY STATUS COMMUNICATION OF NEEDS Skin   Extensive Assist Non-Verbally Normal                       Personal Care Assistance Level of Assistance  Bathing, Feeding, Dressing Bathing Assistance: Maximum assistance Feeding assistance: Maximum assistance Dressing Assistance: Maximum assistance      Functional Limitations Info             SPECIAL CARE FACTORS FREQUENCY  PT (By licensed PT), OT (By licensed OT), Speech therapy     PT Frequency: 5x/wk OT Frequency: 5x/wk     Speech Therapy Frequency: 5x/wk      Contractures Contractures Info: Not present    Additional Factors Info  Code Status, Allergies Code Status Info: DNR Allergies Info: Penicillins           Current Medications (09/03/2019):  This is the current hospital active medication list Current Facility-Administered Medications  Medication Dose Route Frequency Provider Last Rate Last Admin  . acetaminophen (TYLENOL) tablet 650 mg  650 mg Oral Q4H PRN Shalhoub, Sherryll Burger, MD       Or  . acetaminophen (TYLENOL) 160 MG/5ML solution 650 mg  650 mg Per Tube Q4H PRN Shalhoub, Sherryll Burger, MD       Or  . acetaminophen (TYLENOL) suppository 650 mg  650 mg Rectal Q4H PRN Shalhoub, Sherryll Burger, MD      . amLODipine (NORVASC) tablet 10 mg  10 mg Oral Daily Florencia Reasons, MD   10 mg at 09/03/19 [id number]  . aspirin EC tablet 325 mg  325 mg Oral Daily Rosalin Hawking, MD   325 mg at 09/03/19 [id number]  . atorvastatin (LIPITOR) tablet 40 mg  40 mg Oral Daily Rosalin Hawking, MD   40 mg at 09/02/19 1856  . cefTRIAXone (ROCEPHIN) 1 g in sodium chloride 0.9 % 100 mL IVPB  1 g Intravenous Q24H Joette Catching T, MD 200 mL/hr at 09/02/19 1526 1 g at 09/02/19 1526  . clopidogrel (PLAVIX) tablet 75 mg  75 mg Oral Daily Rosalin Hawking, MD   75 mg at 09/03/19 0837  . enalaprilat (VASOTEC) injection 1.25 mg  1.25 mg Intravenous Q6H PRN Shalhoub, Sherryll Burger, MD      . enoxaparin (LOVENOX) injection 40 mg  40 mg Subcutaneous Q24H Eugenie Filler, MD   40 mg at 09/02/19 1528  . LORazepam (ATIVAN) injection 0.5 mg  0.5 mg Intravenous Q6H PRN Florencia Reasons, MD      . Resource ThickenUp Clear   Oral PRN Eugenie Filler, MD      . senna-docusate (Senokot-S) tablet 1 tablet  1 tablet Oral QHS PRN Vernelle Emerald, MD      . sodium chloride flush (NS) 0.9 % injection  10-40 mL  10-40 mL Intracatheter Q12H Florencia Reasons, MD   40 mL at 09/02/19 2200  . sodium chloride flush (NS) 0.9 % injection 10-40 mL  10-40 mL Intracatheter PRN Florencia Reasons, MD         Discharge Medications: Please see discharge summary for a list of discharge medications.  Relevant Imaging Results:  Relevant Lab Results:   Additional Information SS#: [id number]  Geralynn Ochs, LCSW

## 2019-09-03 NOTE — Discharge Instructions (Signed)
Ischemic Stroke    An ischemic stroke is the sudden death of brain tissue. Blood carries oxygen to all areas of the body. This type of stroke happens when your blood does not flow to your brain like normal. Your brain cannot get the oxygen it needs. This is an emergency. It must be treated right away.  Symptoms of a stroke usually happen all of a sudden. You may notice them when you wake up. They can include:  · Weakness or loss of feeling in your face, arm, or leg. This often happens on one side of the body.  · Trouble walking.  · Trouble moving your arms or legs.  · Loss of balance or coordination.  · Feeling confused.  · Trouble talking or understanding what people are saying.  · Slurred speech.  · Trouble seeing.  · Seeing two of one object (double vision).  · Feeling dizzy.  · Feeling sick to your stomach (nauseous) and throwing up (vomiting).  · A very bad headache for no reason.  Get help as soon as any of these problems start. This is important. Some treatments work better if they are given right away. These include:  · Aspirin.  · Medicines to control blood pressure.  · A shot (injection) of medicine to break up the blood clot.  · Treatments given in the blood vessel (artery) to take out the clot or break it up.  Other treatments may include:  · Oxygen.  · Fluids given through an IV tube.  · Medicines to thin out your blood.  · Procedures to help your blood flow better.  What increases the risk?  Certain things may make you more likely to have a stroke. Some of these are things that you can change, such as:  · Being very overweight (obesity).  · Smoking.  · Taking birth control pills.  · Not being active.  · Drinking too much alcohol.  · Using drugs.  Other risk factors include:  · High blood pressure.  · High cholesterol.  · Diabetes.  · Heart disease.  · Being African American, Native American, Hispanic, or [place] Native.  · Being over age 60.  · Family history of stroke.  · Having had blood clots,  stroke, or warning stroke (transient ischemic attack, TIA) in the past.  · Sickle cell disease.  · Being a woman with a history of high blood pressure in pregnancy (preeclampsia).  · Migraine headache.  · Sleep apnea.  · Having an irregular heartbeat (atrial fibrillation).  · Long-term (chronic) diseases that cause soreness and swelling (inflammation).  · Disorders that affect how your blood clots.  Follow these instructions at home:  Medicines  · Take over-the-counter and prescription medicines only as told by your doctor.  · If you were told to take aspirin or another medicine to thin your blood, take it exactly as told by your doctor.  ? Taking too much of the medicine can cause bleeding.  ? If you do not take enough, it may not work as well.  · Know the side effects of your medicines. If you are taking a blood thinner, make sure you:  ? Hold pressure over any cuts for longer than usual.  ? Tell your dentist and other doctors that you take this medicine.  ? Avoid activities that may cause damage or injury to your body.  Eating and drinking  · Follow instructions from your doctor about what you cannot eat or drink.  · Eat   This may include: ? Having experts look at your home to make sure it is safe. ? Putting grab bars in the bedroom and bathroom. ? Using raised toilets. ? Putting a seat in the shower. General instructions  Do not use any tobacco products. ? Examples of these are cigarettes, chewing tobacco, and e-cigarettes. ? If you need help quitting, ask your doctor.  Limit how much alcohol you drink. This means no more than 1 drink a day for nonpregnant women and 2 drinks  a day for men. One drink equals 12 oz of beer, 5 oz of wine, or 1 oz of hard liquor.  If you need help to stop using drugs or alcohol, ask your doctor to refer you to a program or specialist.  Stay active. Exercise as told by your doctor.  Keep all follow-up visits as told by your doctor. This is important. Get help right away if:   You have any signs of a stroke. "BE FAST" is an easy way to remember the main warning signs: ? B - Balance. Signs are dizziness, sudden trouble walking, or loss of balance. ? E - Eyes. Signs are trouble seeing or a change in how you see. ? F - Face. Signs are sudden weakness or loss of feeling of the face, or the face or eyelid drooping on one side. ? A - Arms. Signs are weakness or loss of feeling in an arm. This happens suddenly and usually on one side of the body. ? S - Speech. Signs are sudden trouble speaking, slurred speech, or trouble understanding what people say. ? T - Time. Time to call emergency services. Write down what time symptoms started.  You have other signs of a stroke, such as: ? A sudden, very bad headache with no known cause. ? Feeling sick to your stomach (nausea). ? Throwing up (vomiting). ? Jerky movements you cannot control (seizure). These symptoms may be an emergency. Do not wait to see if the symptoms will go away. Get medical help right away. Call your local emergency services (911 in the [place] Do not drive yourself to the hospital. Summary  An ischemic stroke is the sudden death of brain tissue.  Symptoms of a stroke usually happen all of a sudden. You may notice them when you wake up.  Get help if you have any warning signs of a stroke. This is important. Some treatments work better if they are given right away. This information is not intended to replace advice given to you by your health care provider. Make sure you discuss any questions you have with your health care provider. Document Revised: 09/28/2017 Document  Reviewed: 07/16/2015 Elsevier Patient Education  2020 Elsevier Inc.  

## 2019-09-03 NOTE — TOC Transition Note (Signed)
Transition of Care [facility]) - CM/SW Discharge Note   Patient Details  Name: Rachel Mcguire MRN: [id number] Date of Birth: 03-Nov-1931  [facility]) CM/SW Contact:  Geralynn Ochs, LCSW Phone Number: 09/03/2019, 1:26 PM   Clinical Narrative:   Nurse to call report to [phone], Room 103A.    Final next level of care: Skilled Nursing Facility Barriers to Discharge: Barriers Resolved   Patient Goals and CMS Choice Patient states their goals for this hospitalization and ongoing recovery are:: patient unable to participate in goal setting due to dementia, disorientation CMS Medicare.gov Compare Post Acute Care list provided to:: Patient Represenative (must comment) Choice offered to / list presented to : Adult Children  Discharge Placement              Patient chooses bed at: [facility] Patient to be transferred to facility by: [place] Name of family member notified: Family at bedside Patient and family notified of of transfer: 09/03/19  Discharge Plan and Services                                     Social Determinants of Health (SDOH) Interventions     Readmission Risk Interventions No flowsheet data found.

## 2019-09-03 NOTE — Discharge Summary (Signed)
DISCHARGE SUMMARY  Rachel Mcguire  MR#: [id number]  DOB:[birth year]  Date of Admission: 08/30/2019 Date of Discharge: 09/03/2019  Attending Physician:Alaster Asfaw Hennie Duos, MD  Patient's XO:[id number], Rachel Ha, MD  Consults: Stroke Team   Disposition: D/C to SNF    Follow-up Appts:  Contact information for follow-up providers    [facility]. Schedule an appointment as soon as possible for a visit in 4 week(s).   Specialty: Neurology Contact information: [address] [place] [phone]           Contact information for after-discharge care    Destination    [facility] .   Service: Skilled Nursing Contact information: [address] [place] [address] [phone]                  Tests Needing Follow-up: -ongoing SLP care should be provided in her SNF in hopes of advancing diet further  -consider Palliative Care or Hospice consult should her clinical status decline further after return to SNF   Discharge Diagnoses: Multiple acute to early subacute ischemic strokes right MCA territory Dysphagia Acute metabolic encephalopathy with underlying advanced vascular dementia Klebsiella pneumonia UTI Hypomagnesemia Hypokalemia HTN  Initial presentation: 86 year old with a history of carotid stenosis status post left CEA, HLD, HTN, and dementia who presented from her SNF with a several day history of progressively worsening lethargy, confusion, and poor oral intake.  Hospital Course:  Multiple acute to early subacute ischemic strokes right MCA territory Felt to be related to large vessel disease but cardioembolic cannot be completely ruled out - family understandably does not wish to pursue outpatient cardiac monitoring due to challenges related to severe dementia - MD supported this decision - currently on aspirin Plavix and statin - practicing permissive hypertension - no  further workup to be pursued - desire to focus more on quality of life and comfort - should she decline further at her SNF consultation with Hospice should be considered - stroke workup as completed during this admit was as follows:  CT head -  Extensive chronic small vessel ischemic change of the white matter.    MRI head - Multiple areas of acute to early subacute ischemia within the right MCA territory. Varying signal intensity may indicate multiple events.   CTA H&N - multifocal intracranial stenosis more prominent at b/l M1/M2, b/ ACAs as well as extracranial stenosis with b/l VA origin high gradestenosis and chronic right ICA bulb athero  Carotid Doppler - unremarkable  2D Echo - EF 60 - 65%.   EEG - general background slowing. No epileptiform activity was noted.   B/l LE Venous Dopplers - no DVT  LDL - 114  HgbA1c - 5.8  aspirin 325 mg daily prior to admission, now on aspirin daily and clopidogrel 75 mg daily  Dysphagia Due to above -dysphagia 1 diet per SLP - ongoing SLP care should be provided in her SNF in hopes of advancing diet further   HTN  Long term BP goal 130-150 in setting of severe intracranial stenosis   HLD LDL 114 - goal is < 70 - continue Lipitor 40mg  QD  Acute metabolic encephalopathy with underlying advanced dementia Oriented only to self at baseline -acute worsening due to stroke and UTI  Klebsiella pneumonia UTI Was dosed w/ rocephin during admit - transition to oral abx at time of d/c to complete 7 days of tx   Hypomagnesemia Supplemented during admit - at goal at time of  d/c   Hypokalemia Supplemented during admit - at goal at time of d/c    Allergies as of 09/03/2019      Reactions   Penicillins Itching, Rash   Has patient had a PCN reaction causing immediate rash, facial/tongue/throat swelling, SOB or lightheadedness with hypotension: unknown Has patient had a PCN reaction causing severe rash involving mucus membranes or skin necrosis:  unknown Has patient had a PCN reaction that required hospitalization: unknown Has patient had a PCN reaction occurring within the last 10 years: no If all of the above answers are "NO", then may proceed with Cephalosporin use.      Medication List    STOP taking these medications   feeding supplement (ENSURE ENLIVE) Liqd   Geri-Tussin 100 MG/5ML liquid Generic drug: guaiFENesin   oxyCODONE 5 MG immediate release tablet Commonly known as: Oxy IR/ROXICODONE     TAKE these medications   acetaminophen 325 MG tablet Commonly known as: TYLENOL Take 2 tablets (650 mg total) by mouth every 4 (four) hours as needed for mild pain (or temp > 37.5 C (99.5 F)). What changed:   medication strength  how much to take  when to take this  reasons to take this  Another medication with the same name was removed. Continue taking this medication, and follow the directions you see here.   amLODipine 10 MG tablet Commonly known as: NORVASC Take 1 tablet (10 mg total) by mouth daily. Start taking on: Sep 04, 2019 What changed:   medication strength  how much to take   aspirin 325 MG EC tablet Take 1 tablet (325 mg total) by mouth daily. Start taking on: Sep 04, 2019 What changed: when to take this   atorvastatin 40 MG tablet Commonly known as: LIPITOR Take 1 tablet (40 mg total) by mouth daily.   cephALEXin 500 MG capsule Commonly known as: KEFLEX Take 1 capsule (500 mg total) by mouth every 12 (twelve) hours.   clopidogrel 75 MG tablet Commonly known as: PLAVIX Take 1 tablet (75 mg total) by mouth daily. Start taking on: Sep 04, 2019   donepezil 10 MG tablet Commonly known as: ARICEPT Take 10 mg by mouth at bedtime.   polyethylene glycol 17 g packet Commonly known as: MIRALAX / GLYCOLAX Take 17 g by mouth daily.   senna-docusate 8.6-50 MG tablet Commonly known as: Senokot-S Take 1 tablet by mouth at bedtime as needed for mild constipation. What changed: when to take  this   Vitamin D (Ergocalciferol) 1.25 MG (50000 UNIT) Caps capsule Commonly known as: DRISDOL Take 50,000 Units by mouth once a week.       Day of Discharge BP (!) 154/81 (BP Location: Right Arm)   Pulse 69   Temp 98 F (36.7 C) (Oral)   Resp 18   Ht 5\' 1"  (1.549 m)   Wt 69.1 kg   SpO2 98%   BMI 28.78 kg/m   Physical Exam: General: No acute respiratory distress Lungs: Clear to auscultation bilaterally without wheezes or crackles Cardiovascular: Regular rate and rhythm without murmur gallop or rub normal S1 and S2 Abdomen: Nontender, nondistended, soft, bowel sounds positive, no rebound, no ascites, no appreciable mass Extremities: No significant cyanosis, clubbing, or edema bilateral lower extremities  Basic Metabolic Panel: Recent Labs  Lab 08/30/19 1600 08/31/19 1036 09/01/19 0416 09/02/19 0500 09/03/19 0656  NA 140 138 138 139 139  K 4.1 3.5 3.5 4.0 3.6  CL 102 107 103 107 104  CO2 25 22  24 24 23   GLUCOSE 123* 100* 96 106* 98  BUN 33* 17 14 20 17   CREATININE 0.94 0.65 0.95 0.87 0.75  CALCIUM 9.9 8.4* 9.1 8.9 8.9  MG  --   --  1.8 1.9 1.9    Liver Function Tests: Recent Labs  Lab 08/30/19 1600  AST 25  ALT 21  ALKPHOS 87  BILITOT 0.9  PROT 8.0  ALBUMIN 4.6    CBC: Recent Labs  Lab 08/30/19 1600 08/31/19 1036 09/01/19 0416  WBC 13.2* 7.1 6.5  NEUTROABS 11.9* 5.4  --   HGB 17.1* 14.0 14.5  HCT 52.0* 42.9 44.0  MCV 90.8 92.1 89.4  PLT 342 272 286     Recent Results (from the past 240 hour(s))  Respiratory Panel by RT PCR (Flu A&B, Covid) - Nasopharyngeal Swab     Status: None   Collection Time: 08/30/19  8:29 PM   Specimen: Nasopharyngeal Swab  Result Value Ref Range Status   SARS Coronavirus 2 by RT PCR NEGATIVE NEGATIVE Final    Comment: (NOTE) SARS-CoV-2 target nucleic acids are NOT DETECTED. The SARS-CoV-2 RNA is generally detectable in upper respiratoy specimens during the acute phase of infection. The lowest concentration of  SARS-CoV-2 viral copies this assay can detect is 131 copies/mL. A negative result does not preclude SARS-Cov-2 infection and should not be used as the sole basis for treatment or other patient management decisions. A negative result may occur with  improper specimen collection/handling, submission of specimen other than nasopharyngeal swab, presence of viral mutation(s) within the areas targeted by this assay, and inadequate number of viral copies (<131 copies/mL). A negative result must be combined with clinical observations, patient history, and epidemiological information. The expected result is Negative. Fact Sheet for Patients:  PinkCheek.be Fact Sheet for Healthcare Providers:  GravelBags.it This test is not yet ap proved or cleared by the [place] FDA and  has been authorized for detection and/or diagnosis of SARS-CoV-2 by FDA under an Emergency Use Authorization (EUA). This EUA will remain  in effect (meaning this test can be used) for the duration of the COVID-19 declaration under Section 564(b)(1) of the Act, 21 U.S.C. section 360bbb-3(b)(1), unless the authorization is terminated or revoked sooner.    Influenza A by PCR NEGATIVE NEGATIVE Final   Influenza B by PCR NEGATIVE NEGATIVE Final    Comment: (NOTE) The Xpert Xpress SARS-CoV-2/FLU/RSV assay is intended as an aid in  the diagnosis of influenza from Nasopharyngeal swab specimens and  should not be used as a sole basis for treatment. Nasal washings and  aspirates are unacceptable for Xpert Xpress SARS-CoV-2/FLU/RSV  testing. Fact Sheet for Patients: PinkCheek.be Fact Sheet for Healthcare Providers: GravelBags.it This test is not yet approved or cleared by the [place] FDA and  has been authorized for detection and/or diagnosis of SARS-CoV-2 by  FDA under an Emergency Use Authorization (EUA).  This EUA will remain  in effect (meaning this test can be used) for the duration of the  Covid-19 declaration under Section 564(b)(1) of the Act, 21  U.S.C. section 360bbb-3(b)(1), unless the authorization is  terminated or revoked. Performed at [facility], [place] [address], [place], [place] [address]   Culture, Urine     Status: Abnormal   Collection Time: 08/30/19  9:44 PM   Specimen: Urine, Clean Catch  Result Value Ref Range Status   Specimen Description   Final    URINE, CLEAN CATCH Performed at [facility], [address] [place], [place] [address]    Special Requests   Final    NONE Performed at [facility], [place] [address], [place], [place] [address]    Culture 90,000 COLONIES/mL KLEBSIELLA PNEUMONIAE (A)  Final   Report Status 09/01/2019 FINAL  Final   Organism ID, Bacteria KLEBSIELLA PNEUMONIAE (A)  Final      Susceptibility   Klebsiella pneumoniae - MIC*    AMPICILLIN RESISTANT Resistant     CEFAZOLIN <=4 SENSITIVE Sensitive     CEFTRIAXONE <=1 SENSITIVE Sensitive     CIPROFLOXACIN <=0.25 SENSITIVE Sensitive     GENTAMICIN <=1 SENSITIVE Sensitive     IMIPENEM <=0.25 SENSITIVE Sensitive     NITROFURANTOIN <=16 SENSITIVE Sensitive     TRIMETH/SULFA <=20 SENSITIVE Sensitive     AMPICILLIN/SULBACTAM <=2 SENSITIVE Sensitive     PIP/TAZO <=4 SENSITIVE Sensitive     * 90,000 COLONIES/mL KLEBSIELLA PNEUMONIAE      Time spent in discharge (includes decision making & examination of pt): 35 minutes  09/03/2019, 2:33 PM   Cherene Altes, MD [facility] Office  [phone]

## 2019-09-03 NOTE — Progress Notes (Signed)
SLP Cancellation Note  Patient Details Name: Rachel Mcguire MRN: [id number] DOB: 1932-05-03   Cancelled treatment:       Reason Eval/Treat Not Completed: Pt sleeping soundly; did not arouse despite multiple attempts and sternal rub.  Dtr-in-law at bedside; reports she ate a good breakfast with coughing/distress.  Will re-attempt to see today as schedule allows.  Jamesrobert Ohanesian L. Tivis Ringer, [place] Office number [phone] Pager [phone]    [name] 09/03/2019, 9:50 AM

## 2019-09-04 NOTE — Progress Notes (Signed)
Transportation arrived to return patient back to SNF. Daughter remains at bedside, belongings bagged and taken home by family. No needs voiced at this time.

## 2019-09-12 ENCOUNTER — Non-Acute Institutional Stay: Payer: Self-pay | Admitting: Internal Medicine

## 2019-09-12 DIAGNOSIS — I63511 Cerebral infarction due to unspecified occlusion or stenosis of right middle cerebral artery: Secondary | ICD-10-CM

## 2019-09-12 DIAGNOSIS — Z515 Encounter for palliative care: Secondary | ICD-10-CM

## 2019-09-12 NOTE — Progress Notes (Signed)
[facility] Palliative Care Consult Note Telephone: [phone]  9Fax: [phone]  PATIENT NAME: Rachel Mcguire DOB: [birth year] MRN: [id number]  PRIMARY CARE PROVIDER:   Dr Daylene Posey  REFERRING PROVIDER:  Humberto Leep, NP  RESPONSIBLE PARTY:   Scott Lee(son/POA)   [phone]      RECOMMENDATIONS and PLAN:  Palliative Care Encounter  Z51.5  1.  Advanced care planning:  Explanation of Palliative and Hospice care to son, DIL and g. Daughter.  Reviewed goals of care for pt which are to provide comfort care.  Son would desire to improve health if possible but is realistic that pt's overall health may not improve due to rapid decline and oxygen desaturation today. Pt. Is on a poor trajectory in light of recent CVA and advanced vascular dementia. Son is interested in transitioning to Hospice care if pt is eligible for same.   Advanced directives reviewed with son. Reiewed MOST form and confirmed  selections with him as compared with her living will..  Her advanced directives are DNAR/DNI, comfort measures, no IV fluids,  determine use of antibiotics, no tube feedings and no return to the hospital. MOST and DNR forms were signed and uploaded into chart.  Phone updates to Humberto Leep, NP and Dr. Gildardo Cranker who agree with recommendations for transition to Hospice care at [facility].  Order received from [facility] and nsg supervisor faxed to [facility] hospice referral center. Palliative care will continue to f/u with patient until admission into Hospice care.   2.  Altered mental status:  Progressive decline since experiencing recent CVA and end stage vascular dementia.  FAST stage 7e.  Nearing end of life. Suspect a new CVA. Recommendation for transition to Hospice care for comfort measures.   3. Hypoxia:  Acute onset today and improved with use of supplemental oxygen.  Not stable, however with consistent episodes of apnea. No expectation for improvement.  Continue oxygen use,  provide comfort care. Order written for Morphine sulfate concentrate 20mg /ml. Take 0.149ml(2.5mg ) PO q 2 hrs prn shortness of breath or pain in the event pt has dyspnea prior to admission to Hospice.   4.  Dysphagia:  Progressive decline.  High risk of aspiration with inability to swallow.Oral care only  I spent 180 minutes providing this consultation,  from 1330 to 1630. More than 50% of the time in this consultation was spent coordinating communication with patient, son/POA, facility NP, Hospice MD Dr. Eulas Post and clinical staff.    HISTORY OF PRESENT ILLNESS:  Rachel Mcguire is a 86 y.o. year old female with multiple medical problems including advnaced vascular dementia,  Atrial fibrillation, L hip fracture. She was most recently hospitalized from 4/29-09/03/19 due to an acute occlusive CVA and a UTI.  Scans denote multiple CVA history.  She has not been ambulatory since returning to the SNF.Son reports a drastic decline of health since a fall and hip fracture in 2018.  Previously, she was ambulatory with assist of a walker, fed self but had little communication.  Nursing staff reports that she became hypoxic today in the mids 80s, was short of breath and much less responsive.  Oxygen at 2L via [place] was begun at 2L/min and oxygen level maintains at 98-99%. She has been unable to eat or take her oral medications today due to inability to swallow.  Palliative Care was asked to help address goals of care.   CODE STATUS: DNAR/DNI  PPS: 10%  Oral care only  HOSPICE ELIGIBILITY/DIAGNOSIS:  YES/ Cerebrovascular accident, End stage vascular dementia  PAST MEDICAL HISTORY:  Past Medical History:  Diagnosis Date  . Arthritis    both hands  . Cancer ([place]) SKIN CANCERS REMOVED FROM FACE LEG KNEE      BCC/SCC  nose skin cancer  . Carotid artery occlusion    S/P left cardotid endarterectomy  . Hyperglycemia   . Hyperlipidemia   . Hypertension   . Osteoporosis   . Overactive bladder   . Peripheral  vascular disease ([place])   . Uterine fibroid      ALLERGIES:  Allergies  Allergen Reactions  . Penicillins Itching and Rash    Has patient had a PCN reaction causing immediate rash, facial/tongue/throat swelling, SOB or lightheadedness with hypotension: unknown Has patient had a PCN reaction causing severe rash involving mucus membranes or skin necrosis: unknown Has patient had a PCN reaction that required hospitalization: unknown Has patient had a PCN reaction occurring within the last 10 years: no If all of the above answers are "NO", then may proceed with Cephalosporin use.     PERTINENT MEDICATIONS:  Outpatient Encounter Medications as of 09/12/2019  Medication Sig  . acetaminophen (TYLENOL) 325 MG tablet Take 2 tablets (650 mg total) by mouth every 4 (four) hours as needed for mild pain (or temp > 37.5 C (99.5 F)).  Marland Kitchen amLODipine (NORVASC) 10 MG tablet Take 1 tablet (10 mg total) by mouth daily.  Marland Kitchen aspirin EC 325 MG EC tablet Take 1 tablet (325 mg total) by mouth daily.  Marland Kitchen atorvastatin (LIPITOR) 40 MG tablet Take 1 tablet (40 mg total) by mouth daily.  . cephALEXin (KEFLEX) 500 MG capsule Take 1 capsule (500 mg total) by mouth every 12 (twelve) hours.  . clopidogrel (PLAVIX) 75 MG tablet Take 1 tablet (75 mg total) by mouth daily.  Marland Kitchen donepezil (ARICEPT) 10 MG tablet Take 10 mg by mouth at bedtime.   . polyethylene glycol (MIRALAX / GLYCOLAX) packet Take 17 g by mouth daily.  Marland Kitchen senna-docusate (SENOKOT-S) 8.6-50 MG tablet Take 1 tablet by mouth at bedtime as needed for mild constipation. (Patient taking differently: Take 1 tablet by mouth daily. )  . Vitamin D, Ergocalciferol, (DRISDOL) 1.25 MG (50000 UNIT) CAPS capsule Take 50,000 Units by mouth once a week.   No facility-administered encounter medications on file as of 09/12/2019.     PHYSICAL EXAM:   General: Terminally ill and frail appearing, thin EENT:  R eye closed and L eye is partially closed.  Good bilat.blink reflex.  Does  not focus on any object.  Resting with both open.  Very dry mucus membranes. Bites on damp oral swab but does not swallow.  Cardiovascular: regular rate and rhythm Pulmonary: clear ant fields.  Consistent episodes of apnea Abdomen: soft, nontender, + bowel sounds GU: no suprapubic tenderness Extremities: no edema, no joint deformities Skin: exposed skin is intact.  Poor skin turgor Neurological: Non responsive other than reflexes.  Constant movement of LUE without purpose. Does not follow commands.  No movement of the RUE.    Gonzella Lex, NP-C

## 2019-09-13 ENCOUNTER — Other Ambulatory Visit: Payer: Self-pay

## 2019-09-26 ENCOUNTER — Inpatient Hospital Stay: Payer: Medicare PPO | Admitting: Adult Health

## 2019-10-02 DEATH — deceased

## 2021-07-02 IMAGING — CT CT HEAD W/O CM
3 series · 15 of 47 positions shown, 18 images · non-contrast
Comparison: MRI 08/05/2015

CLINICAL DATA: Altered mental status

EXAM:
CT HEAD WITHOUT CONTRAST
TECHNIQUE: Contiguous axial images were obtained from the base of the skull
through the vertex without intravenous contrast.

[Series 2: head wo · axial · 0.42mm/px · z∈[-168,-33]mm · 9 of 33 slices shown, 12 images]
[im 3/33  brain]
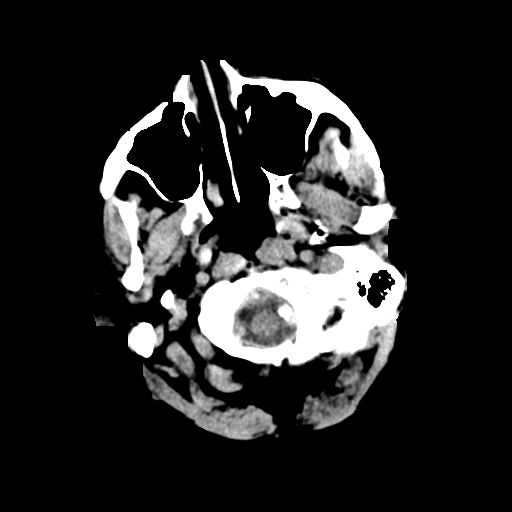
[im 3/33  bone]
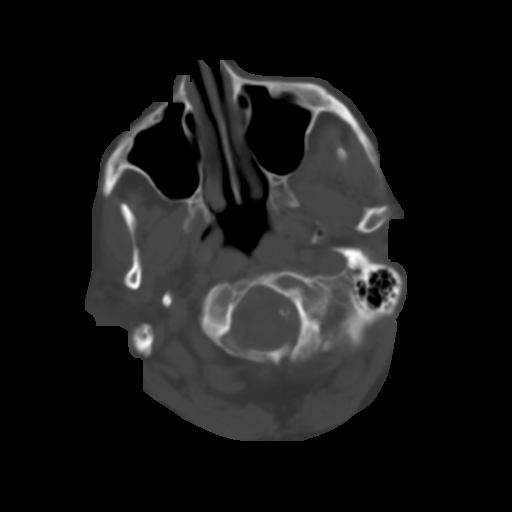
[im 6/33  brain]
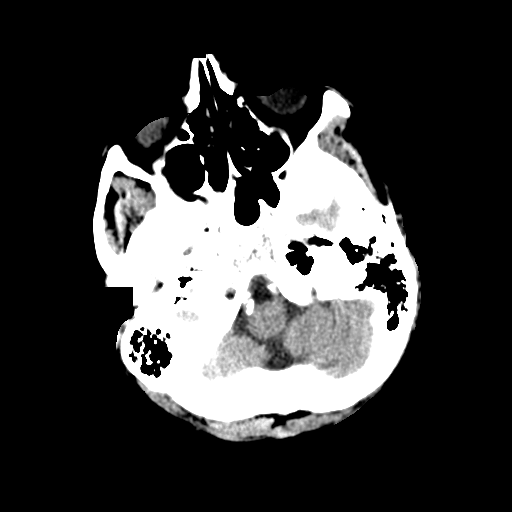
[im 9/33  brain]
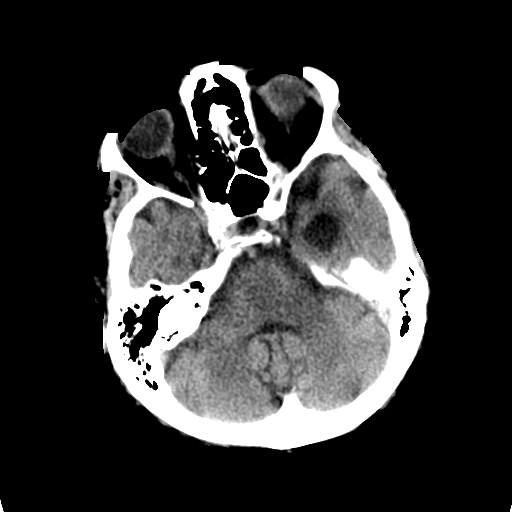
[im 13/33  brain]
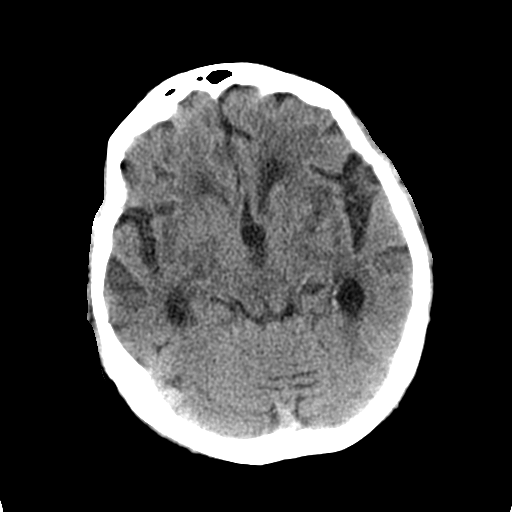
[im 17/33  brain]
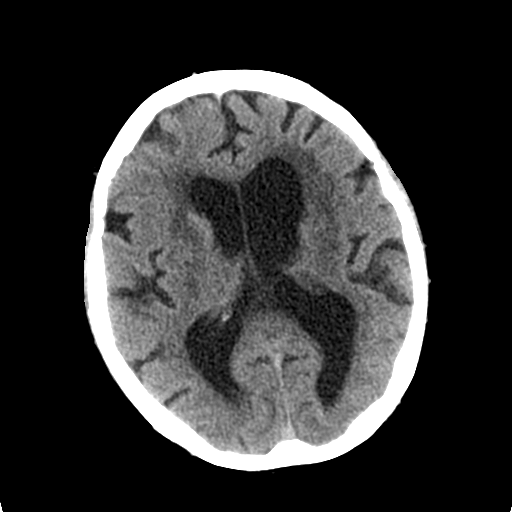
[im 17/33  bone]
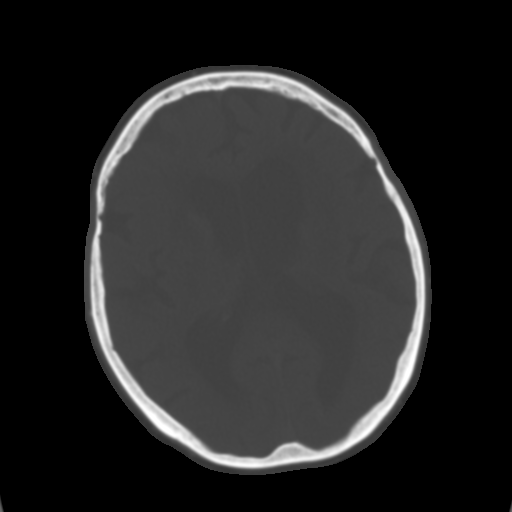
[im 20/33  brain]
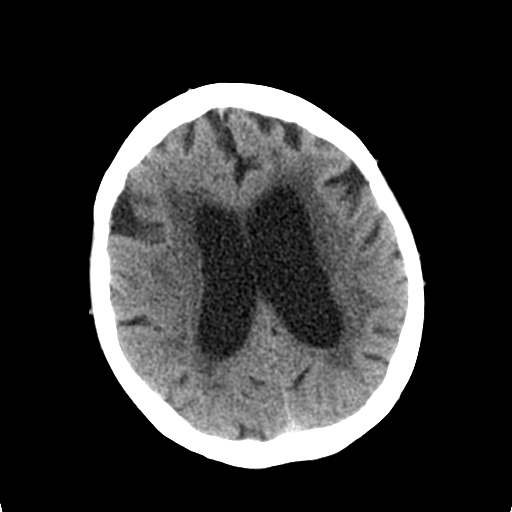
[im 24/33  brain]
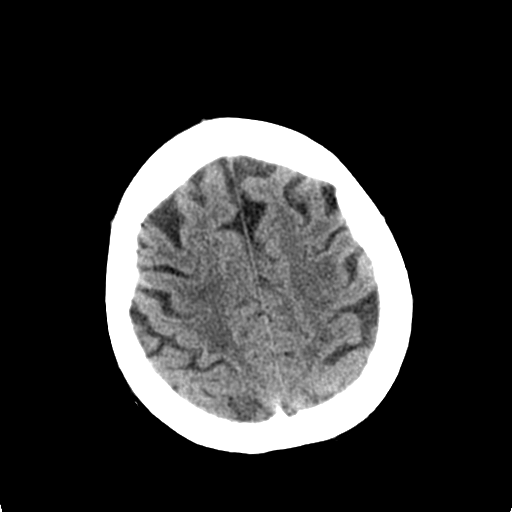
[im 27/33  brain]
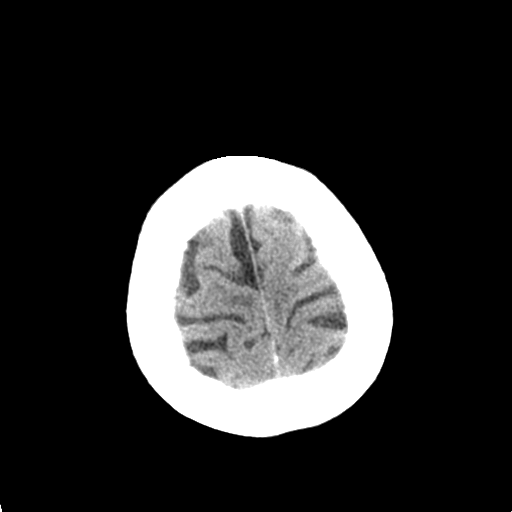
[im 30/33  brain]
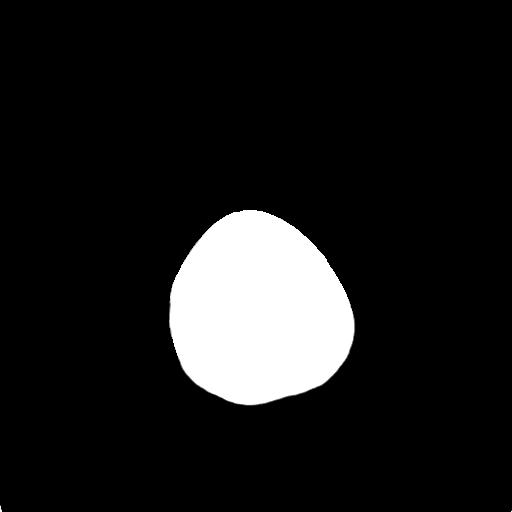
[im 30/33  bone]
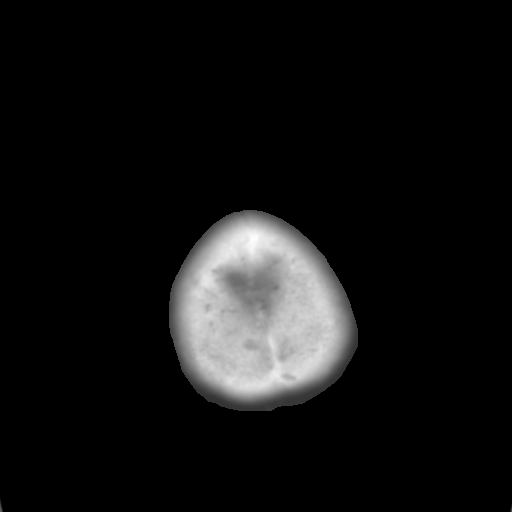

[Series 4: coronal soft tissue · coronal · 0.36mm/px · 3 of 68 slices shown]
[im 23/68  brain]
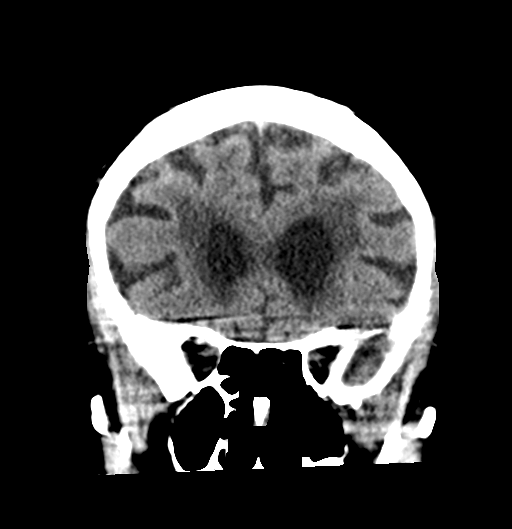
[im 30/68  brain]
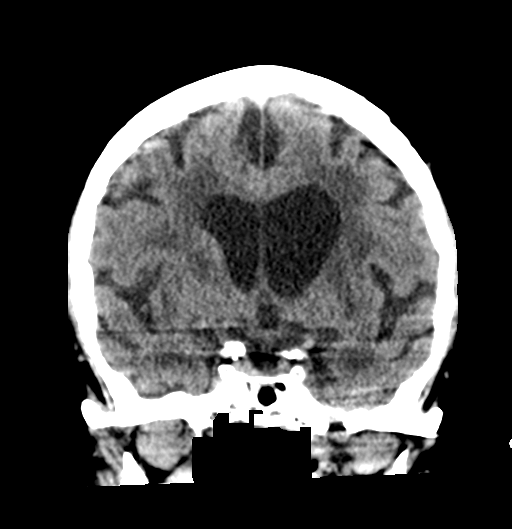
[im 38/68  brain]
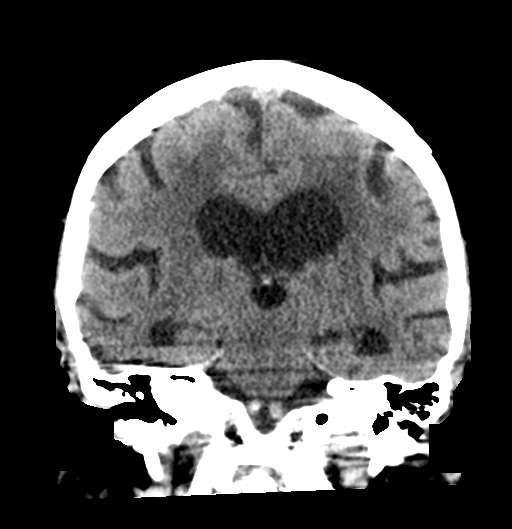

[Series 5: sagittal soft tissue · sagittal · 0.35mm/px · 3 of 58 slices shown]
[im 20/58  brain]
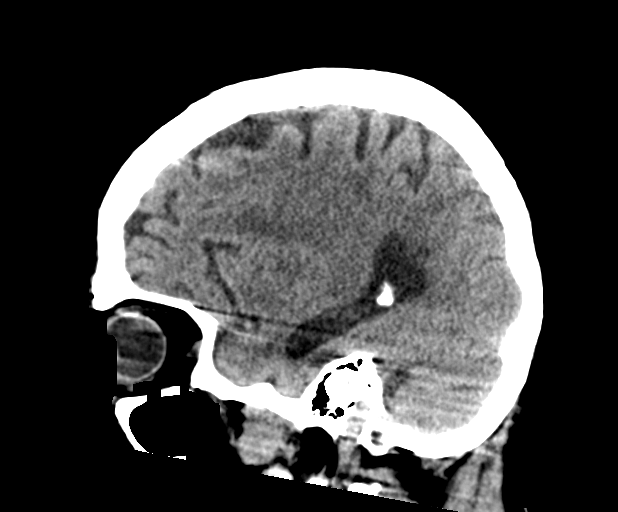
[im 29/58  brain]
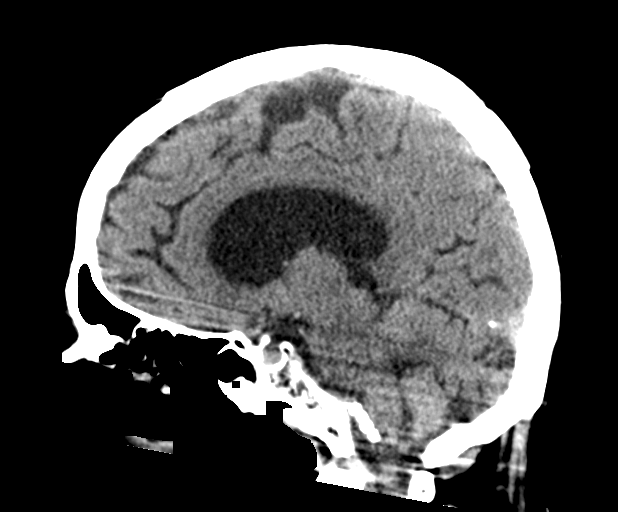
[im 39/58  brain]
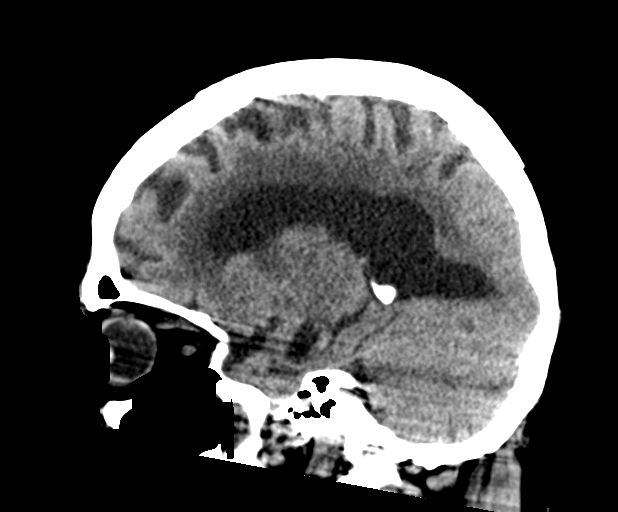

[15 of 47 positions shown; findings below may reference images not displayed]

FINDINGS: Brain: No acute territorial infarction, hemorrhage, or intracranial
mass is visualized. Mild atrophy. Stable ventricular enlargement
somewhat out of proportion to atrophy. Extensive hypodensity within
the white matter consistent with chronic small vessel ischemic
change. More focal hypodensity within the right subcortical white
matter posteriorly may reflect small vessel ischemic change or age
indeterminate white matter infarct. Chronic lacunar infarct in the
right thalamus.

Vascular: No hyperdense vessels. Vertebral and carotid vascular
calcification

Skull: Normal. Negative for fracture or focal lesion.

Sinuses/Orbits: No acute finding.

Other: None
IMPRESSION: 1. Extensive chronic small vessel ischemic change of the white
matter. More focal hypodensity within the right posterior
subcortical white matter may reflect additional change of
small-vessel ischemia however age indeterminate white matter infarct
cannot be excluded.
2. Atrophy. Generalized ventricular enlargement somewhat out of
proportion to atrophy, possible normal pressure hydrocephalus.

## 2021-07-02 IMAGING — CT CT CHEST W/ CM
2 of 3 series · 15 of 36 positions shown, 18 images · IV contrast (omnipaque)
Comparison: Chest plain film, dated August 30, 2019

CLINICAL DATA: Altered mental status. Evaluate interstitial
infiltrate seen on earlier chest plain film.

EXAM:
CT CHEST WITH CONTRAST
TECHNIQUE: Multidetector CT imaging of the chest was performed during
intravenous contrast administration.
CONTRAST:  100mL OMNIPAQUE IOHEXOL 350 MG/ML SOLN

[Series 5: axial st · axial · 0.69mm/px · z∈[+378,+650]mm · 12 of 160 slices shown, 15 images]
[im 12/160  mediastinal]
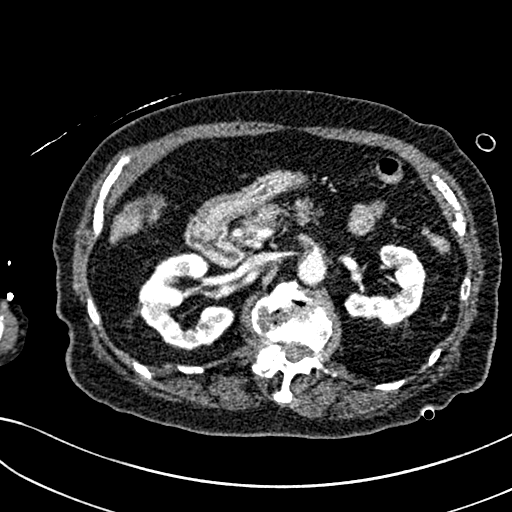
[im 12/160  lung]
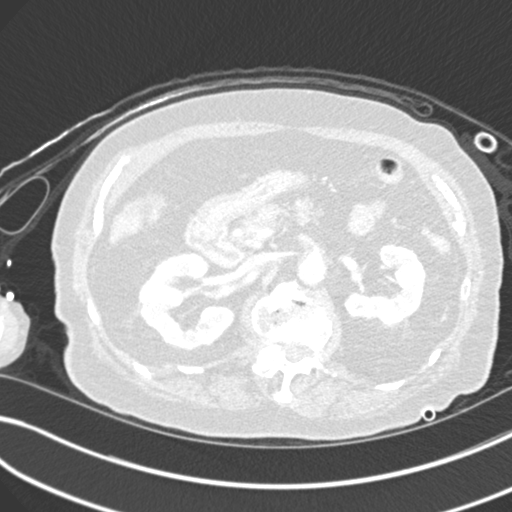
[im 24/160  lung]
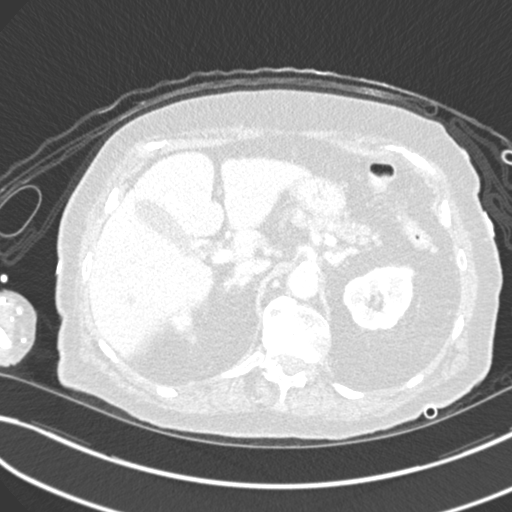
[im 36/160  lung]
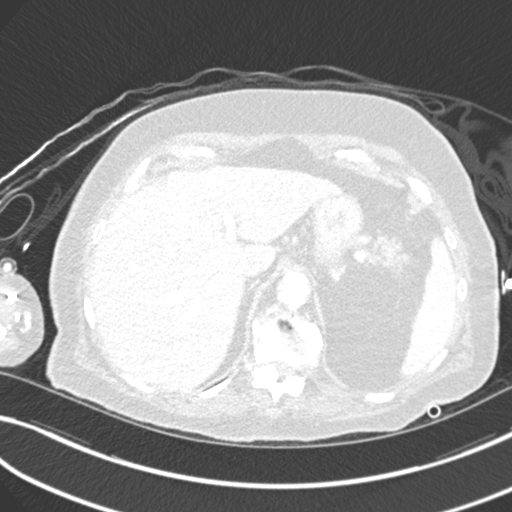
[im 48/160  lung]
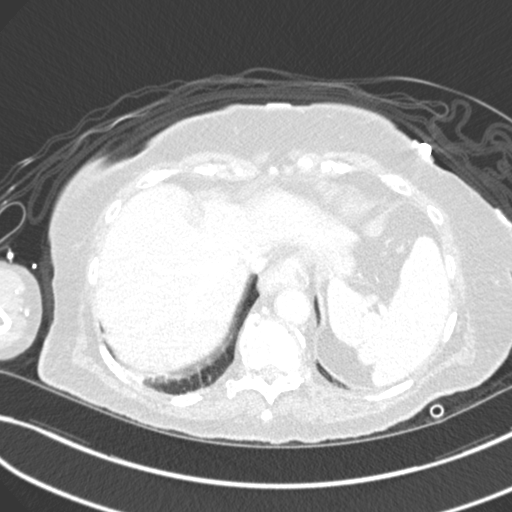
[im 59/160  mediastinal]
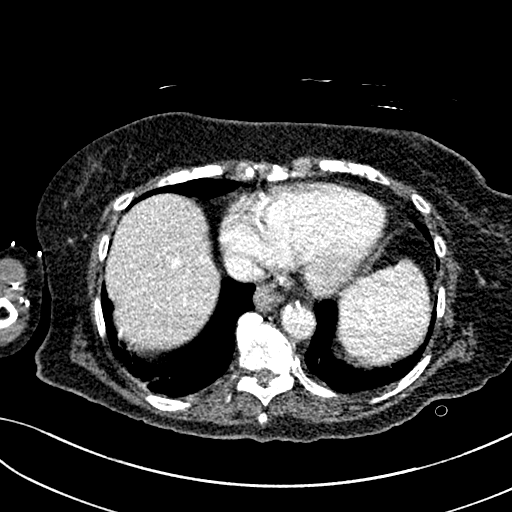
[im 59/160  lung]
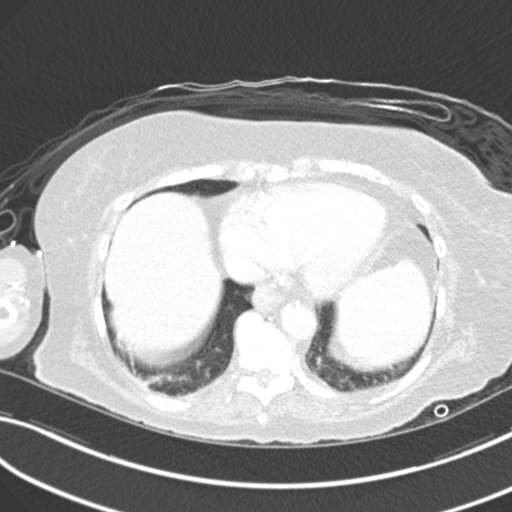
[im 71/160  lung]
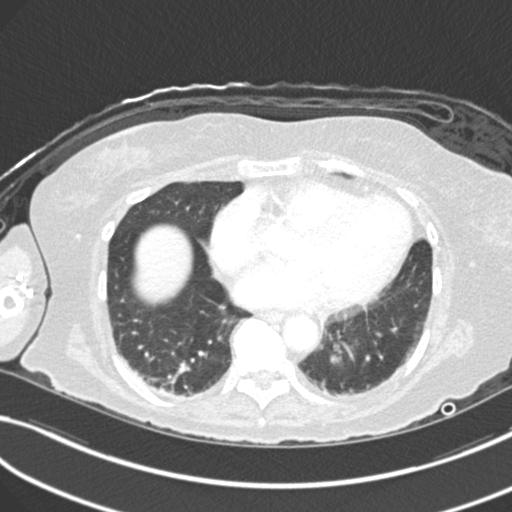
[im 89/160  lung]
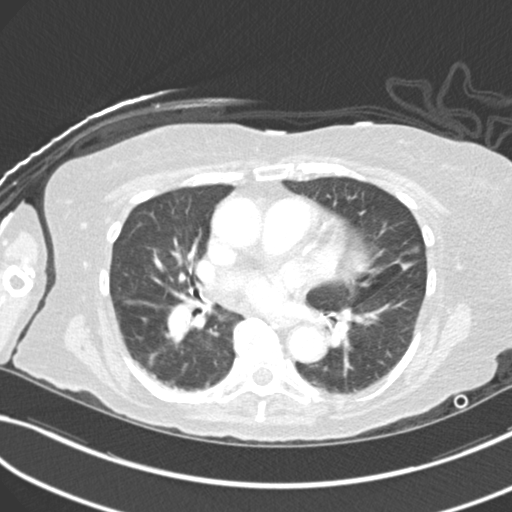
[im 101/160  lung]
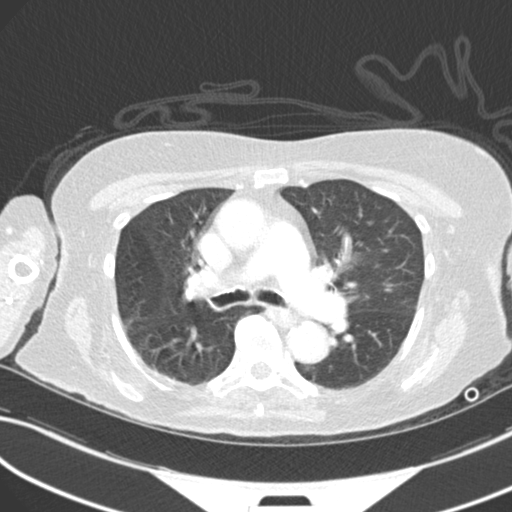
[im 112/160  mediastinal]
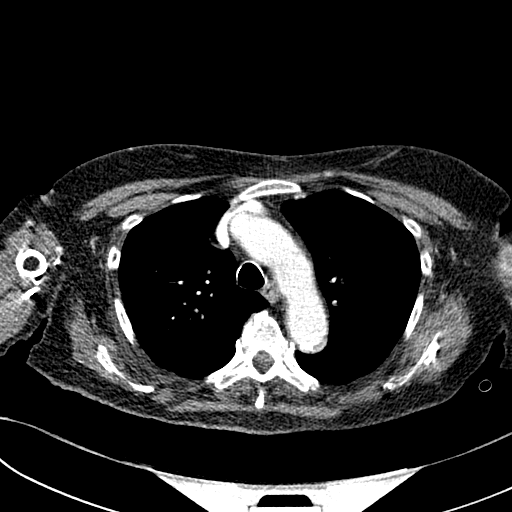
[im 112/160  lung]
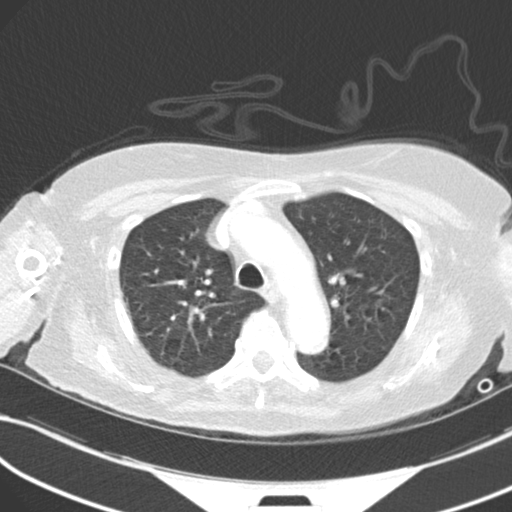
[im 124/160  lung]
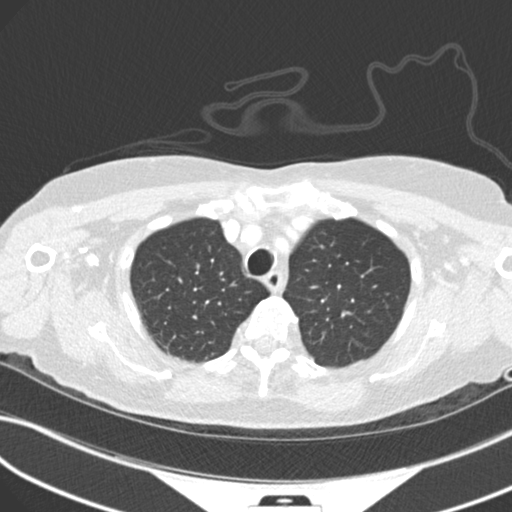
[im 136/160  lung]
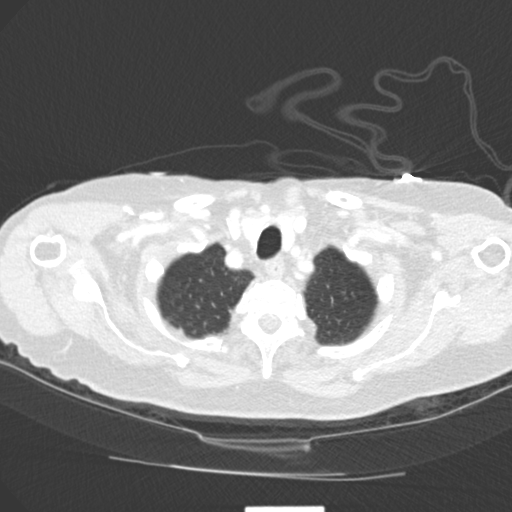
[im 148/160  lung]
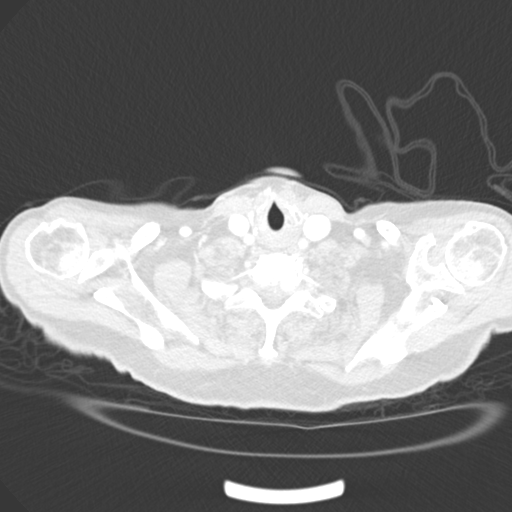

[Series 7: coronal · coronal · 0.66mm/px · 3 of 122 slices shown]
[im 25/122  lung]
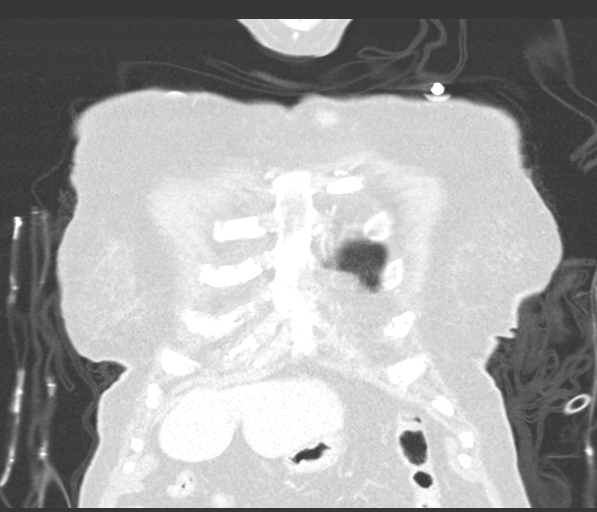
[im 49/122  lung]
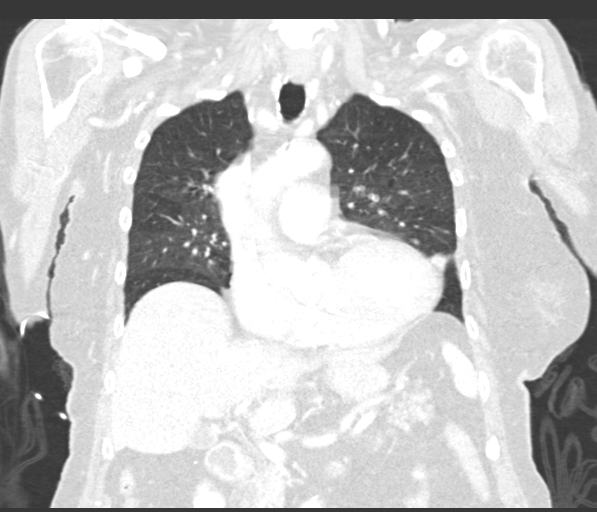
[im 73/122  lung]
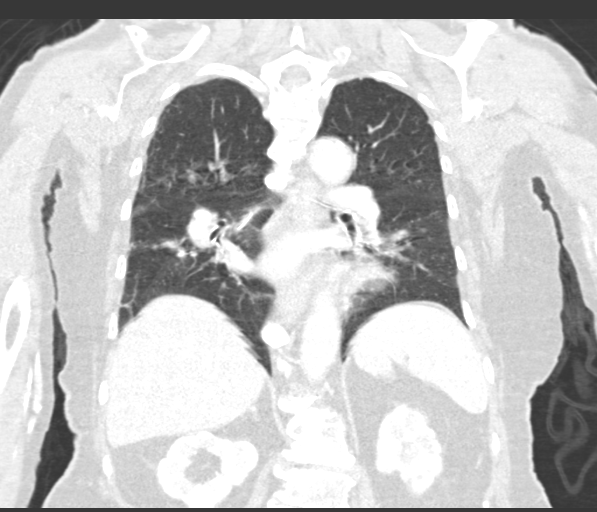

[15 of 36 positions shown; findings below may reference images not displayed]

FINDINGS: Cardiovascular: There is moderate to marked severity calcification
of the thoracic aorta. Normal heart size. No pericardial effusion.

Mediastinum/Nodes: No enlarged mediastinal, hilar, or axillary lymph
nodes. Subcentimeter cysts are seen within the right lobe of the
thyroid gland.

Lungs/Pleura: Very mild atelectasis is seen within the bilateral
lung bases.

There is no evidence of a pleural effusion or pneumothorax.

Upper Abdomen: There is a small hiatal hernia.

A 1.0 cm focus of parenchymal low attenuation is seen within the
posterior aspect of the right lobe of the liver.

Musculoskeletal: Multilevel degenerative changes seen throughout the
thoracic spine.
IMPRESSION: 1. Very mild bilateral lower lobe atelectasis.
2. Small hiatal hernia.
3. Findings likely consistent with a small hepatic cyst versus
hemangioma.

Aortic Atherosclerosis ([id number]).
